# Patient Record
Sex: Female | Born: 1935 | Race: White | Hispanic: No | Smoking: Former smoker
Health system: Southern US, Community
[De-identification: ages and names within clinical notes are randomized; demographics above are authoritative.]

## PROBLEM LIST (undated history)

## (undated) DIAGNOSIS — N183 Chronic kidney disease, stage 3 unspecified: Secondary | ICD-10-CM

## (undated) DIAGNOSIS — E119 Type 2 diabetes mellitus without complications: Secondary | ICD-10-CM

## (undated) DIAGNOSIS — I5032 Chronic diastolic (congestive) heart failure: Secondary | ICD-10-CM

## (undated) DIAGNOSIS — E43 Unspecified severe protein-calorie malnutrition: Secondary | ICD-10-CM

## (undated) DIAGNOSIS — E785 Hyperlipidemia, unspecified: Secondary | ICD-10-CM

## (undated) DIAGNOSIS — M8580 Other specified disorders of bone density and structure, unspecified site: Secondary | ICD-10-CM

## (undated) DIAGNOSIS — H353230 Exudative age-related macular degeneration, bilateral, stage unspecified: Secondary | ICD-10-CM

## (undated) DIAGNOSIS — I480 Paroxysmal atrial fibrillation: Secondary | ICD-10-CM

## (undated) DIAGNOSIS — D509 Iron deficiency anemia, unspecified: Secondary | ICD-10-CM

## (undated) DIAGNOSIS — C4361 Malignant melanoma of right upper limb, including shoulder: Secondary | ICD-10-CM

## (undated) DIAGNOSIS — R634 Abnormal weight loss: Secondary | ICD-10-CM

## (undated) DIAGNOSIS — I350 Nonrheumatic aortic (valve) stenosis: Secondary | ICD-10-CM

## (undated) DIAGNOSIS — I050 Rheumatic mitral stenosis: Secondary | ICD-10-CM

## (undated) DIAGNOSIS — I272 Pulmonary hypertension, unspecified: Secondary | ICD-10-CM

## (undated) DIAGNOSIS — R1310 Dysphagia, unspecified: Secondary | ICD-10-CM

## (undated) DIAGNOSIS — I10 Essential (primary) hypertension: Secondary | ICD-10-CM

## (undated) DIAGNOSIS — H3530 Unspecified macular degeneration: Secondary | ICD-10-CM

## (undated) DIAGNOSIS — K7689 Other specified diseases of liver: Secondary | ICD-10-CM

## (undated) DIAGNOSIS — J90 Pleural effusion, not elsewhere classified: Secondary | ICD-10-CM

## (undated) DIAGNOSIS — R911 Solitary pulmonary nodule: Secondary | ICD-10-CM

## (undated) HISTORY — PX: VAGINAL HYSTERECTOMY: SUR661

## (undated) HISTORY — DX: Hyperlipidemia, unspecified: E78.5

## (undated) HISTORY — PX: CATARACT EXTRACTION W/ INTRAOCULAR LENS  IMPLANT, BILATERAL: SHX1307

## (undated) HISTORY — DX: Essential (primary) hypertension: I10

---

## 1997-12-27 ENCOUNTER — Other Ambulatory Visit: Admission: RE | Admit: 1997-12-27 | Discharge: 1997-12-27 | Payer: Self-pay | Admitting: *Deleted

## 1998-03-03 ENCOUNTER — Encounter: Payer: Self-pay | Admitting: *Deleted

## 1998-03-03 ENCOUNTER — Ambulatory Visit (HOSPITAL_COMMUNITY): Admission: RE | Admit: 1998-03-03 | Discharge: 1998-03-03 | Payer: Self-pay | Admitting: *Deleted

## 1999-03-06 ENCOUNTER — Encounter: Payer: Self-pay | Admitting: *Deleted

## 1999-03-06 ENCOUNTER — Ambulatory Visit (HOSPITAL_COMMUNITY): Admission: RE | Admit: 1999-03-06 | Discharge: 1999-03-06 | Payer: Self-pay | Admitting: *Deleted

## 2000-03-08 ENCOUNTER — Encounter: Payer: Self-pay | Admitting: *Deleted

## 2000-03-08 ENCOUNTER — Ambulatory Visit (HOSPITAL_COMMUNITY): Admission: RE | Admit: 2000-03-08 | Discharge: 2000-03-08 | Payer: Self-pay | Admitting: *Deleted

## 2001-03-14 ENCOUNTER — Encounter: Payer: Self-pay | Admitting: *Deleted

## 2001-03-14 ENCOUNTER — Ambulatory Visit (HOSPITAL_COMMUNITY): Admission: RE | Admit: 2001-03-14 | Discharge: 2001-03-14 | Payer: Self-pay | Admitting: *Deleted

## 2001-05-03 HISTORY — PX: ANKLE SURGERY: SHX546

## 2001-07-25 ENCOUNTER — Ambulatory Visit: Admission: RE | Admit: 2001-07-25 | Discharge: 2001-07-25 | Payer: Self-pay | Admitting: Endocrinology

## 2002-03-04 ENCOUNTER — Encounter: Payer: Self-pay | Admitting: Emergency Medicine

## 2002-03-04 ENCOUNTER — Encounter: Payer: Self-pay | Admitting: Orthopedic Surgery

## 2002-03-04 ENCOUNTER — Inpatient Hospital Stay (HOSPITAL_COMMUNITY): Admission: EM | Admit: 2002-03-04 | Discharge: 2002-03-09 | Payer: Self-pay | Admitting: Emergency Medicine

## 2002-03-05 ENCOUNTER — Encounter: Payer: Self-pay | Admitting: Orthopedic Surgery

## 2002-03-07 ENCOUNTER — Encounter: Payer: Self-pay | Admitting: Orthopedic Surgery

## 2002-10-22 ENCOUNTER — Encounter: Payer: Self-pay | Admitting: Endocrinology

## 2002-10-22 ENCOUNTER — Ambulatory Visit (HOSPITAL_COMMUNITY): Admission: RE | Admit: 2002-10-22 | Discharge: 2002-10-22 | Payer: Self-pay | Admitting: Endocrinology

## 2003-05-14 ENCOUNTER — Ambulatory Visit (HOSPITAL_COMMUNITY): Admission: RE | Admit: 2003-05-14 | Discharge: 2003-05-14 | Payer: Self-pay | Admitting: Orthopedic Surgery

## 2003-10-23 ENCOUNTER — Ambulatory Visit (HOSPITAL_COMMUNITY): Admission: RE | Admit: 2003-10-23 | Discharge: 2003-10-23 | Payer: Self-pay | Admitting: Endocrinology

## 2004-05-03 HISTORY — PX: BACK SURGERY: SHX140

## 2004-10-02 ENCOUNTER — Emergency Department (HOSPITAL_COMMUNITY): Admission: EM | Admit: 2004-10-02 | Discharge: 2004-10-02 | Payer: Self-pay | Admitting: Emergency Medicine

## 2004-10-11 ENCOUNTER — Emergency Department (HOSPITAL_COMMUNITY): Admission: EM | Admit: 2004-10-11 | Discharge: 2004-10-11 | Payer: Self-pay | Admitting: *Deleted

## 2004-12-25 ENCOUNTER — Encounter (HOSPITAL_BASED_OUTPATIENT_CLINIC_OR_DEPARTMENT_OTHER): Admission: RE | Admit: 2004-12-25 | Discharge: 2005-03-25 | Payer: Self-pay | Admitting: Surgery

## 2005-03-29 ENCOUNTER — Encounter (HOSPITAL_BASED_OUTPATIENT_CLINIC_OR_DEPARTMENT_OTHER): Admission: RE | Admit: 2005-03-29 | Discharge: 2005-04-09 | Payer: Self-pay | Admitting: Surgery

## 2005-05-13 ENCOUNTER — Ambulatory Visit (HOSPITAL_COMMUNITY): Admission: RE | Admit: 2005-05-13 | Discharge: 2005-05-13 | Payer: Self-pay | Admitting: Endocrinology

## 2006-05-30 ENCOUNTER — Ambulatory Visit (HOSPITAL_COMMUNITY): Admission: RE | Admit: 2006-05-30 | Discharge: 2006-05-30 | Payer: Self-pay | Admitting: Endocrinology

## 2006-06-13 ENCOUNTER — Encounter: Admission: RE | Admit: 2006-06-13 | Discharge: 2006-06-13 | Payer: Self-pay | Admitting: Endocrinology

## 2007-06-08 ENCOUNTER — Ambulatory Visit (HOSPITAL_COMMUNITY): Admission: RE | Admit: 2007-06-08 | Discharge: 2007-06-08 | Payer: Self-pay | Admitting: Endocrinology

## 2008-07-12 ENCOUNTER — Ambulatory Visit (HOSPITAL_COMMUNITY): Admission: RE | Admit: 2008-07-12 | Discharge: 2008-07-12 | Payer: Self-pay | Admitting: Endocrinology

## 2009-08-01 ENCOUNTER — Ambulatory Visit (HOSPITAL_COMMUNITY): Admission: RE | Admit: 2009-08-01 | Discharge: 2009-08-01 | Payer: Self-pay | Admitting: Endocrinology

## 2010-05-24 ENCOUNTER — Encounter: Payer: Self-pay | Admitting: Endocrinology

## 2010-05-27 ENCOUNTER — Inpatient Hospital Stay (HOSPITAL_COMMUNITY)
Admission: EM | Admit: 2010-05-27 | Discharge: 2010-06-02 | Disposition: A | Discharge status: Other Acute Inpatient Hospital | Payer: Self-pay | Source: Home / Self Care | Attending: Endocrinology | Admitting: Endocrinology

## 2010-05-27 LAB — LIPASE, BLOOD: Lipase: 13 U/L (ref 11–59)

## 2010-05-27 LAB — CK TOTAL AND CKMB (NOT AT ARMC)
CK, MB: 29.1 ng/mL (ref 0.3–4.0)
Relative Index: 1.9 (ref 0.0–2.5)

## 2010-05-27 LAB — DIFFERENTIAL
Basophils Relative: 0 % (ref 0–1)
Eosinophils Absolute: 0 10*3/uL (ref 0.0–0.7)
Eosinophils Relative: 0 % (ref 0–5)
Neutrophils Relative %: 91 % — ABNORMAL HIGH (ref 43–77)

## 2010-05-27 LAB — APTT: aPTT: 37 seconds (ref 24–37)

## 2010-05-27 LAB — COMPREHENSIVE METABOLIC PANEL
AST: 57 U/L — ABNORMAL HIGH (ref 0–37)
BUN: 25 mg/dL — ABNORMAL HIGH (ref 6–23)
CO2: 23 mEq/L (ref 19–32)
Calcium: 9.3 mg/dL (ref 8.4–10.5)
Chloride: 101 mEq/L (ref 96–112)
Creatinine, Ser: 0.91 mg/dL (ref 0.4–1.2)
GFR calc Af Amer: >60 mL/min (ref >60)
GFR calc non Af Amer: 58 mL/min — ABNORMAL LOW (ref >60)
Total Bilirubin: 1.6 mg/dL — ABNORMAL HIGH (ref 0.3–1.2)

## 2010-05-27 LAB — URINALYSIS, ROUTINE W REFLEX MICROSCOPIC
Ketones, ur: >80 mg/dL — AB
Protein, ur: >300 mg/dL — AB
Urine Glucose, Fasting: 250 mg/dL — AB
Urobilinogen, UA: 1 mg/dL (ref 0.0–1.0)

## 2010-05-27 LAB — CBC
MCH: 27.3 pg (ref 26.0–34.0)
MCV: 80.6 fL (ref 78.0–100.0)
Platelets: 207 10*3/uL (ref 150–400)
RBC: 4.69 MIL/uL (ref 3.87–5.11)
RDW: 13.9 % (ref 11.5–15.5)
WBC: 15 10*3/uL — ABNORMAL HIGH (ref 4.0–10.5)

## 2010-05-27 LAB — PROTIME-INR: Prothrombin Time: 18.4 seconds — ABNORMAL HIGH (ref 11.6–15.2)

## 2010-05-27 LAB — URINE MICROSCOPIC-ADD ON

## 2010-05-27 LAB — GLUCOSE, CAPILLARY: Glucose-Capillary: 378 mg/dL — ABNORMAL HIGH (ref 70–99)

## 2010-05-28 LAB — COMPREHENSIVE METABOLIC PANEL
Albumin: 2.1 g/dL — ABNORMAL LOW (ref 3.5–5.2)
Alkaline Phosphatase: 95 U/L (ref 39–117)
BUN: 28 mg/dL — ABNORMAL HIGH (ref 6–23)
CO2: 25 mEq/L (ref 19–32)
Chloride: 103 mEq/L (ref 96–112)
GFR calc non Af Amer: 46 mL/min — ABNORMAL LOW (ref >60)
Glucose, Bld: 275 mg/dL — ABNORMAL HIGH (ref 70–99)
Potassium: 3.7 mEq/L (ref 3.5–5.1)
Total Bilirubin: 0.8 mg/dL (ref 0.3–1.2)

## 2010-05-28 LAB — GLUCOSE, CAPILLARY: Glucose-Capillary: 158 mg/dL — ABNORMAL HIGH (ref 70–99)

## 2010-05-28 LAB — CBC
Hemoglobin: 10.2 g/dL — ABNORMAL LOW (ref 12.0–15.0)
Platelets: 206 10*3/uL (ref 150–400)
RBC: 3.86 MIL/uL — ABNORMAL LOW (ref 3.87–5.11)
WBC: 10.9 10*3/uL — ABNORMAL HIGH (ref 4.0–10.5)

## 2010-05-28 LAB — CARDIAC PANEL(CRET KIN+CKTOT+MB+TROPI)
CK, MB: 5.5 ng/mL — ABNORMAL HIGH (ref 0.3–4.0)
Relative Index: 1.7 (ref 0.0–2.5)
Relative Index: 1.7 (ref 0.0–2.5)
Total CK: 322 U/L — ABNORMAL HIGH (ref 7–177)
Troponin I: 0.05 ng/mL (ref 0.00–0.06)
Troponin I: 0.03 ng/mL (ref 0.00–0.06)

## 2010-05-28 LAB — HEPARIN LEVEL (UNFRACTIONATED): Heparin Unfractionated: 0.1 IU/mL — ABNORMAL LOW (ref 0.30–0.70)

## 2010-05-28 LAB — BRAIN NATRIURETIC PEPTIDE: Pro B Natriuretic peptide (BNP): 371 pg/mL — ABNORMAL HIGH (ref 0.0–100.0)

## 2010-05-29 LAB — BASIC METABOLIC PANEL
CO2: 25 mEq/L (ref 19–32)
Chloride: 108 mEq/L (ref 96–112)
GFR calc non Af Amer: >60 mL/min (ref >60)
Glucose, Bld: 160 mg/dL — ABNORMAL HIGH (ref 70–99)
Potassium: 3.9 mEq/L (ref 3.5–5.1)
Sodium: 139 mEq/L (ref 135–145)

## 2010-05-29 LAB — HEPARIN LEVEL (UNFRACTIONATED): Heparin Unfractionated: 0.14 IU/mL — ABNORMAL LOW (ref 0.30–0.70)

## 2010-05-29 LAB — GLUCOSE, CAPILLARY
Glucose-Capillary: 312 mg/dL — ABNORMAL HIGH (ref 70–99)
Glucose-Capillary: 213 mg/dL — ABNORMAL HIGH (ref 70–99)

## 2010-05-29 LAB — CBC
HCT: 33.8 % — ABNORMAL LOW (ref 36.0–46.0)
Hemoglobin: 11.1 g/dL — ABNORMAL LOW (ref 12.0–15.0)
MCV: 81.4 fL (ref 78.0–100.0)
Platelets: 209 10*3/uL (ref 150–400)
RBC: 4.15 MIL/uL (ref 3.87–5.11)
WBC: 10.1 10*3/uL (ref 4.0–10.5)

## 2010-05-29 LAB — BRAIN NATRIURETIC PEPTIDE: Pro B Natriuretic peptide (BNP): 218 pg/mL — ABNORMAL HIGH (ref 0.0–100.0)

## 2010-05-30 LAB — COMPREHENSIVE METABOLIC PANEL
Albumin: 2 g/dL — ABNORMAL LOW (ref 3.5–5.2)
Alkaline Phosphatase: 79 U/L (ref 39–117)
CO2: 27 mEq/L (ref 19–32)
Calcium: 8.5 mg/dL (ref 8.4–10.5)
Chloride: 105 mEq/L (ref 96–112)
GFR calc Af Amer: >60 mL/min (ref >60)
Sodium: 139 mEq/L (ref 135–145)
Total Protein: 5.3 g/dL — ABNORMAL LOW (ref 6.0–8.3)

## 2010-05-30 LAB — GLUCOSE, CAPILLARY
Glucose-Capillary: 215 mg/dL — ABNORMAL HIGH (ref 70–99)
Glucose-Capillary: 243 mg/dL — ABNORMAL HIGH (ref 70–99)
Glucose-Capillary: 230 mg/dL — ABNORMAL HIGH (ref 70–99)

## 2010-05-30 LAB — CBC
HCT: 31.2 % — ABNORMAL LOW (ref 36.0–46.0)
Hemoglobin: 10 g/dL — ABNORMAL LOW (ref 12.0–15.0)
MCHC: 32.1 g/dL (ref 30.0–36.0)

## 2010-05-31 LAB — COMPREHENSIVE METABOLIC PANEL
AST: 14 U/L (ref 0–37)
Albumin: 2 g/dL — ABNORMAL LOW (ref 3.5–5.2)
Alkaline Phosphatase: 80 U/L (ref 39–117)
BUN: 17 mg/dL (ref 6–23)
Creatinine, Ser: 0.81 mg/dL (ref 0.4–1.2)
GFR calc Af Amer: >60 mL/min (ref >60)
Potassium: 4.4 mEq/L (ref 3.5–5.1)
Total Protein: 5.2 g/dL — ABNORMAL LOW (ref 6.0–8.3)

## 2010-05-31 LAB — GLUCOSE, CAPILLARY
Glucose-Capillary: 201 mg/dL — ABNORMAL HIGH (ref 70–99)
Glucose-Capillary: 188 mg/dL — ABNORMAL HIGH (ref 70–99)

## 2010-05-31 LAB — DIFFERENTIAL
Myelocytes: 0 %
Neutro Abs: 7.5 10*3/uL (ref 1.7–7.7)
Neutrophils Relative %: 81 % — ABNORMAL HIGH (ref 43–77)
Promyelocytes Absolute: 0 %
nRBC: 0 /100 WBC

## 2010-05-31 LAB — CBC
MCV: 81.7 fL (ref 78.0–100.0)
Platelets: 220 10*3/uL (ref 150–400)
RDW: 14.6 % (ref 11.5–15.5)
WBC: 9.2 10*3/uL (ref 4.0–10.5)

## 2010-06-01 LAB — CBC
MCH: 27.1 pg (ref 26.0–34.0)
MCHC: 33.4 g/dL (ref 30.0–36.0)
Platelets: 229 10*3/uL (ref 150–400)
RDW: 14.4 % (ref 11.5–15.5)

## 2010-06-01 LAB — GLUCOSE, CAPILLARY: Glucose-Capillary: 174 mg/dL — ABNORMAL HIGH (ref 70–99)

## 2010-06-01 LAB — HEPARIN LEVEL (UNFRACTIONATED): Heparin Unfractionated: 0.46 IU/mL (ref 0.30–0.70)

## 2010-06-02 LAB — COMPREHENSIVE METABOLIC PANEL
ALT: 21 U/L (ref 0–35)
Albumin: 2.1 g/dL — ABNORMAL LOW (ref 3.5–5.2)
Alkaline Phosphatase: 82 U/L (ref 39–117)
Glucose, Bld: 185 mg/dL — ABNORMAL HIGH (ref 70–99)
Potassium: 3.9 mEq/L (ref 3.5–5.1)
Sodium: 144 mEq/L (ref 135–145)
Total Protein: 5.4 g/dL — ABNORMAL LOW (ref 6.0–8.3)

## 2010-06-02 LAB — GLUCOSE, CAPILLARY: Glucose-Capillary: 171 mg/dL — ABNORMAL HIGH (ref 70–99)

## 2010-06-02 LAB — CBC
HCT: 32.7 % — ABNORMAL LOW (ref 36.0–46.0)
RDW: 14.5 % (ref 11.5–15.5)
WBC: 6.8 10*3/uL (ref 4.0–10.5)

## 2010-06-02 NOTE — Discharge Summary (Signed)
NAMEGENIE, WENKE                ACCOUNT NO.:  [id number]  MEDICAL RECORD NO.:  [id number]          PATIENT TYPE:  INP  LOCATION:  1444                         FACILITY:  [facility]  PHYSICIAN:  Tera Mater. Evlyn Kanner, M.D. DATE OF BIRTH:  [birth year]  DATE OF ADMISSION:  05/27/2010 DATE OF DISCHARGE:                              DISCHARGE SUMMARY   DATE OF ANTICIPATED DISCHARGE:  06/03/2010  DISCHARGE DIAGNOSES: 1. Fall with rhabdomyolysis, now clinically improved with no evidence     of syncope. 2. Atrial fibrillation with an rapidly to response, now converted to     sinus rhythm. 3. Hypertension with fair control. 4. Urinary tract infection with Escherichia coli, now treated 5. Macular degeneration. 6. Type 2 diabetes with fair control, now transitioned back to oral     agents.  CONSULTATIONS:  Cardiology, [facility] saw her and followed her through here.  PROCEDURE:  Included telemetry monitoring.  She also underwent an echocardiogram.  She also had a CT of the head.  HISTORY OF PRESENT ILLNESS:  Ms. Hegstrom is a 77 year old white female, longstanding patient to my practice who lives independently.  She was walking around her home and fell and could not get up.  She was down for 20 plus hours and came into [place] with multiple new issues.  Fortunately, nothing was broken during this fall.  There was no evidence of any syncope.  She did, however, have a significant UTI and issues of new atrial fibrillation with rapid ventricular response.  The patient has done moderately well while here.  She had been very sore and very slow to mobilize.  Just yesterday, she was able to turn over partially with plus assistance.  She has not done much walking.  Her soreness is slowly improving.  Her appetite is doing well.  She is having no breathing trouble or chest pain.  She had converted to sinus rhythm and stayed in sinus rhythm.  She is not a good Coumadin candidate.  Only treated  with aspirin at a modest dose, long term.  This is her first ever cardiac issue.  Her mentation has been fine while here.  Her oxygen saturations have dropped a bit at times, but is doing better now.  Blood pressure was 149/73, pulse 83, respirations 16, temperature 92, O2 saturation 94%.  LABORATORY DATA:  This morning, chemistries reveal sodium 144, potassium 3.9, chloride 105, CO2 of 32, BUN 12, creatinine 0.64, glucose of 185, albumin is low at 2.1, total protein 5.4, estimated GFR is greater than 60.  This morning her white count 6800, hemoglobin 10.7, platelets 284,000.  At presentation, white count was 15,000, hemoglobin 12.8, platelets 208,000.  Initial chemistries, sodium 137, potassium 4.3, chloride 101, CO2 of 23, BUN 25, creatinine 0.91, glucose of 230, estimated GFR greater than 60.  A CK was elevated at 1516 with an MB of 29.1 and relative index was normal at 1.9, troponin was 0.04.  Repeat CK was down to 662 on the second hospital day, then down to 409, then down to 322.  I do not think we have checked it since then. Hemoglobin A1c  was reasonable for age at 7.3%.  BNP on the 27th was only mildly elevated at 218.  Multiple heparin levels were done.  RADIOLOGY TESTING:  A hip x-ray was done on 27th, no acute finding, bilateral hip arthritis, more on the left.  A CT of the head showed no acute intracranial abnormality, atrophy and chronic microvascular disease was present.  A chest x-ray at presentation, enlargement of the cardiac silhouette with no evidence of pulmonary edema, pneumonia  or pleural effusion.  There was some bony changes with spurring in right shoulder and spinal hardware in mid thoracic area.  An echocardiogram was done on the 26th and showed left ventricular ejection fraction normal at 55-60%, left ventricular diastolic parameters were normal. Left atrium was mildly to moderately dilated.  Systolic function was hyperdynamic in the right and a small  pericardial effusion posterior to the heart was noted.  HOSPITAL COURSE:  In summary, a 77 year old female coming after a fall with findings of UTI, rhabdomyolysis with intact renal function and this general soreness and slowness to mobilize.  Her atrial fibrillation has resolved and she is back in sinus rhythm.  She is about to finish out her treatment for her urinary tract infection.  The patient is doing well, but needs some rehabilitation time to get her strength back and get back to independent living.  I think she can return to home setting.  DISCHARGE MEDICATIONS:  Include: 1. Tylenol 650, 1 every 4 hours as needed. 2. Aspirin 81, 2 tablets daily which equals to 160 mg. 3. She is on Cipro 500 twice daily and probably needs that through     February 1. 4. She is newly on diltiazem extended release 180 mg once daily.  This     replaces her prior Norvasc. 5. She is on metoprolol 75 mg twice daily.  This is also a new     medication. 6. We are resuming her oral agents and she is on Actos plus metformin     30/1000, she takes 1/2 tablet once daily. 7. She is on Diovan 320 once daily. 8. Glipizide XL 5 once daily. 9. Onglyza 5 mg once daily. 10.She had been treated with Lantus while here.  I am hoping that she     can go back to not needing insulin.  Obviously, she needed to be     watched, we can keep her on sliding scale for the short-term with 2     units starting at 120-150; 151-175, 4 units and over 176, 6 units     of NovoLog with meals.  No bedtime coverage is necessary.  The patient does need physical and occupational therapy.  She does have visual deficits.  Will have to be compensated for.  We need to watch her heart rhythm as well.          ______________________________ Tera Mater. Evlyn Kanner, M.D.     SAS/MEDQ  D:  06/02/2010  T:  06/02/2010  Job:  [id number]  Electronically Signed by Adrian Prince M.D. on 06/02/2010 10:02:19 PM

## 2010-06-02 NOTE — H&P (Signed)
Latasha Proctor, Latasha Proctor NO.:  [id number]  MEDICAL RECORD NO.:  [id number]          PATIENT TYPE:  EMS  LOCATION:  ED                           FACILITY:  [facility]  PHYSICIAN:  Tera Mater. [place], M.D. DATE OF BIRTH:  [birth year]  DATE OF ADMISSION:  05/27/2010 DATE OF DISCHARGE:                             HISTORY & PHYSICAL   HISTORY OF PRESENT ILLNESS:  Latasha Proctor is a 77 year old white female, longstanding patient of my practice, with a history of type 2 diabetes under good control, hypertension, and advanced macular degeneration. Her last visit with me was in mid December at which time she was doing excellent.  She was in her usual state of good health and had made some dinner and was walking around in her apartment yesterday.  Her foot started to tip and she fell to the floor.  She was unable to get up due to weakness and laid on the floor for roughly 22 hours.  She was awake and alert before the fall.  She remembers all the details preceding it and after it.  She heard the phone ring several times and friends finally found her now 22 hours later.  She was then brought to the emergency room and found to have a new problem with rapid atrial fibrillation, which had not been noted before.  She also has some rhabdomyolysis and some volume deficits.  At the present time, she feels reasonably well.  She is still a bit weak.  She has some left lower back soreness.  She notes no chest pain or no sense of palpitations.  She is not short of breath and has not had any wheezing.  She has no abdominal pain and her bowels are working well.  She was eating without trouble and has had no trouble with this.  She denies any headache.  Her vision is at her poor general baseline with advanced macular degeneration.  She notes no weakness or numbness anywhere.  She is in good spirits.  She is at her excellent mentation baseline.  She has not had any falls like this before.  She is an  appropriate person who lives independently.  PAST MEDICAL HISTORY:  Type 2 diabetes, dating back to about 865, controlled with oral agents, the last A1c was 6.4%.  She has had some diabetic retinopathy, but has primary problem with her vision based on advanced macular degeneration.  She has had a cyst in her thyroid.  She has had hypertension.  She has had carotid disease with 1% to 39% on the right back in 2005.  SURGICAL HISTORY:  Total abdominal hysterectomy in 1990, endometrial polypectomy, laser surgery with macular degeneration, ankle fracture, cataract extraction bilaterally.  FAMILY HISTORY:  Dad died at 62 with heart disease.  Mother died at 74 with old age.  Two brothers are deceased.  She is a widowed mother of 1 and grandmother of 2.  She is a nonsmoker, nondrinker.  MEDICATION LIST:  As of mid December was, 1. Amlodipine 5 mg once daily. 2. Diovan 320 mg once daily. 3. Glucotrol XL 5 mg once daily. 4.  Actoplus Met 30/1000 one-half tablet daily. 5. Centrum Silver daily. 6. Aspirin 81 mg daily. 7. Aleve 220 daily. 8. Onglyza 5 mg daily. 9. Benazepril 20 mg daily.  ALLERGIES:  Her allergies are listed as none.  PHYSICAL EXAMINATION:  VITAL SIGNS:  Blood pressure right now is 123/74, pulse rate is 130 irregularly irregular and atrial fib on the monitor, respiration rate is 20, temperature is 98.0, O2 saturation is 99%. GENERAL:  We have a relatively healthy-appearing white female, sitting up in no distress. HEENT:  Sclerae anicteric.  Extraocular movements are intact.  There is no evidence of head or face trauma.  No evidence of jaw or biting of her tongue are noted.  Tongue protrudes midline.  Face is symmetric.  Oral mucous membranes are moist. NECK:  Supple.  I could not appreciate any bruits here in the emergency room. LUNGS:  Clear without wheezes, rales, or rhonchi.  No accessory muscles are in use. HEART:  Tachycardic and regular with systolic  murmur. ABDOMEN:  Soft, nontender with no masses, no splenomegaly, no areas of tenderness are noted. EXTREMITIES:  Strong distal pulses with trace to 1+ edema.  Significant bunion changes are noted on the feet. NEUROLOGIC:  The patient is awake, alert.  Her mentation is excellent. Speech is clear.  No resting tremor is present.  She recognizes me easily upon entrance to the room.  She is talking with her preacher without difficulty.  She notes good memory of all these events as noted.  LABORATORY DATA:  She has too numerous to count white cells in her urine with 3-6 red cells, it is yellow and cloudy, specific gravity 1.020, pH of 6, urine glucose 250, small bilirubin, greater than 80 mg percent ketones, greater than 300 mg percent protein, and moderate leukocytes. Troponin is normal at 0.04, lipase is normal at 13.  Her CK is 1516 with an MB of 29.1 and relative index of 1.9.  Her chemistries show sodium 137, potassium 4.3, chloride 101, CO2 of 23, BUN 25, creatinine 0.91, glucose 230.  Estimated GFR is 58 mL per minute.  Alkaline phosphatase is 118, total bilirubin is 1.6, AST is 57, ALT is 29, albumin is 2.9, calcium 9.3.  White count is 15,000, hemoglobin 12.8, MCV 80.6, platelets 207,000.  INR is 1.51, PTT is 37.  RADIOLOGY TESTING:  A chest x-ray shows enlargement of the cardiac silhouette with no evidence of pulmonary edema, pneumonia, or pleural effusion. Chronic bony changes were noted with degenerative changes in the right shoulder.  Spinal hardware was in place in the mid thoracic region as well.  In summary, we have a 77 year old white female with a fall at home without syncope.  She does have some moderate rhabdomyolysis with an intact renal function.  She had new issues of urinary tract infection, has already received her first dose of medications, in rapid atrial fibrillation which is totally a new problem.  Her cardiac enzymes are fine with no evidence of demand  ischemia.  We really have not done any cardiac tests on her in the past.  The patient is tolerating this well with blood pressure normal to high.  She is relatively and remarkably asymptomatic.  At the present time, we are going to bring her into the step-down unit, put her on a Cardizem drip, and check enzymes in the morning again.  We are going to check an echocardiogram just to get an idea of ejection fraction and left atrial size.  Antibiotics for the UTI  will be given.  Her blood pressure medicines will be continued.  Her blood sugar medicines will be changed to a sliding scale only at the present time with all the oral agents.  There is no evidence any fluid overload from her agents.  A minor liver function testing will be rechecked.  There is no evidence of any stroke or neurologic deficits.  I did call Cardiology for them to read the echo and see her tomorrow.  The patient should do quite well.  We will gently hydrate her, but not overdue it based on not knowing her ejection fraction.  She will be at bed rest status. Subcutaneous Lovenox will be given.          ______________________________ Tera Mater. Evlyn Kanner, M.D.     SAS/MEDQ  D:  05/27/2010  T:  05/28/2010  Job:  [id number]  Electronically Signed by Adrian Prince M.D. on 06/02/2010 10:02:15 PM

## 2010-07-28 ENCOUNTER — Other Ambulatory Visit (HOSPITAL_COMMUNITY): Payer: Self-pay | Admitting: Endocrinology

## 2010-07-28 DIAGNOSIS — Z1231 Encounter for screening mammogram for malignant neoplasm of breast: Secondary | ICD-10-CM

## 2010-08-14 ENCOUNTER — Ambulatory Visit (HOSPITAL_COMMUNITY)
Admission: RE | Admit: 2010-08-14 | Discharge: 2010-08-14 | Disposition: A | Discharge status: Home / Self Care | Payer: Medicare Other | Source: Ambulatory Visit | Attending: Endocrinology | Admitting: Endocrinology

## 2010-08-14 DIAGNOSIS — Z1231 Encounter for screening mammogram for malignant neoplasm of breast: Secondary | ICD-10-CM | POA: Insufficient documentation

## 2010-09-18 NOTE — Op Note (Signed)
NAME:  Latasha Proctor, Latasha Proctor                          ACCOUNT NO.:  [id number]   MEDICAL RECORD NO.:  [id number]                   PATIENT TYPE:  INP   LOCATION:  0481                                 FACILITY:  [facility]   PHYSICIAN:  Georges Lynch. Darrelyn Hillock, M.D.             DATE OF BIRTH:  10/19/1917   DATE OF PROCEDURE:  03/05/2002  DATE OF DISCHARGE:                                 OPERATIVE REPORT   SURGEON:  Georges Lynch. Darrelyn Hillock, M.D.   ASSISTANT:  Ebbie Ridge. Paitsel, P.A.   PREOPERATIVE DIAGNOSIS:  Trimalleolar fracture, right ankle.   POSTOPERATIVE DIAGNOSIS:  Trimalleolar fracture, right ankle.   OPERATION:  Open reduction internal fixation of trimalleolar fracture, right  ankle.   PROCEDURE:  An initial prep was carried out with a Betadine scrub followed  by a Duraprep scrub.  Following this, the leg was exsanguinated with an  Esmarch and the tourniquet was elevated to 350 mmHg.  An incision was made  after I did an initial reduction of the ankle.  The incision was made over  the medial malleolus at which time I identified a medial malleolar fracture.  There was a very small fragment.  I reduced the fragment.  The drill hole  and a guide pin was placed across the fracture site after I inspected the  joint.  There were no loose fragments in the joint.  The drill hole was made  across the fracture site and a screw was inserted but the fracture fragment  was so small, it was severely osteoporotic.  The screw really was not able  to hold the fracture, so I removed that screw and washer and then made an  attempt to reduce the fracture again and whatever fragments I could gather  together, and I utilized two threaded Steinmann pins and I was able to  stabilize the medial ankle.  There was a gap there where the fracture  occurred so I inserted some bone graft with ALLOMATRIX-C bone graft into the  fracture site.  Great care was taken not to put any of the graft in the  joint.  I then repaired the  deltoid tendon and ligament in the usual  fashion.  The wound then was closed in the usual fashion over two threaded  Steinmann pins.  The pins were cut flush with the bone.  The ankle then was  placed in inversion and a short leg cast was applied.  Note I was able to  easily reduce the posterior malleolar fracture fragment as well.  The  patient was placed in a short leg cast as I mentioned.  She left the  operating room in satisfactory condition.  Prior to surgery she was given 1  gram of IV Ancef.  Ronald A. Darrelyn Hillock, M.D.   RAG/MEDQ  D:  03/05/2002  T:  03/05/2002  Job:  [id number]   cc:   Durwin Reges., M.D.  [phone] [place]  [place] [address]  Fax: [phone]

## 2010-09-18 NOTE — Discharge Summary (Signed)
NAME:  Latasha Proctor, Latasha Proctor                          ACCOUNT NO.:  [id number]   MEDICAL RECORD NO.:  [id number]                   PATIENT TYPE:  INP   LOCATION:  0481                                 [facility]   PHYSICIAN:  Georges Lynch. Gioffre, M.D.             DATE OF BIRTH:  10/30/1917   DATE OF ADMISSION:  03/04/2002  DATE OF DISCHARGE:  03/09/2002                                 DISCHARGE SUMMARY   ADMISSION DIAGNOSES:  1. Status post fall and trimalleolar ankle fracture on the right.  2. Diabetes mellitus type 2.  3. Hypertension.  4. Hypercholesterolemia.  5. Urinary incontinence.  6. Macular degeneration.   DISCHARGE DIAGNOSES:  1. Status post fall and trimalleolar right ankle fracture, status post open     reduction internal fixation of right ankle.  2. Type 2 diabetes mellitus.  3. Hypertension.  4. Hypercholesterolemia.  5. Macular degeneration.  6. Urinary incontinence.   PROCEDURES:  The patient was taken to the operating room on March 05, 2002  and underwent an ORIF of the right ankle.  Surgeon was Dr. Ranee Gosselin;  assistant was Terie Purser, P.A.-C.  Surgery was done under general  anesthesia.   CONSULTS:  1. Physical medicine and rehabilitation with Dr. Faith Rogue.  2. Physical therapy/occupational therapy.  3. Social Web designer.  4. Pharmacy for Coumadin.  5. Medical consultant:  [facility], Dr. Adrian Prince and Dr.     Geoffry Paradise.   BRIEF HISTORY:  The patient is an 77 year old female who fell at home on  March 04, 2002.  The patient says she tripped; denies any loss of  consciousness.  She was subsequently brought to the [facility] emergency  department where she was revealed to have a trimalleolar ankle fracture and  dislocation on the right.   LABORATORY DATA:  CBC on admission revealed hemoglobin 12.1, hematocrit  34.9, white blood cell count 10.1, red blood cell count 3.99.  Differential  on this showed  neutrophil high at 85, lymph low at 9.  Coagulation studies  on admission were all within normal limits.  At the time of discharge, PT  and INR were 22.8 and 3.3 on Coumadin therapy.  Routine chemistry on  admission showed glucose high at 211.  Urinalysis on admission showed 250  mg/dcl of glucose, trace amount of ketones.   EKG on admission revealed sinus tachycardia.  Preoperative x-rays on  March 04, 2002 revealed a trimalleolar right ankle fracture with a slight  tibiotalar subluxation.  Preoperative chest film on March 04, 2002  revealed cardiomegaly; there was no active lung disease.  Follow-up ankle  postsurgery on March 07, 2002 revealed status post wire placement across  distal right tibial fracture.   HOSPITAL COURSE:  The patient was admitted to [facility] and taken  to the operating room.  She underwent the above-stated procedure without  complications.  The patient tolerated  the procedure well and was allowed to  return to the recovery room and the orthopedic floor to continue  postoperative care.  Postoperative day #1 on March 06, 2002 the patient  was complaining of right ankle pain as expected.  Otherwise, she was doing  well, had a Tmax of 101.3, was afebrile at the time of being seen.  Rehab  consult was pending; physical therapy and occupational therapy were to see  on this day.  On March 07, 2002, postoperative day #2, the patient was  resting comfortably, improved from yesterday.  Rehab felt that it was best  for her to go to a skilled nursing facility.  She was continuing with  physical therapy and occupational therapy.  On March 08, 2002,  postoperative day #3, the patient was doing well, had no complaints, good  sensation in the foot.  On March 09, 2002, seen by medicine; clinically  was doing well.  Was okay to be discharged to [facility].   DISPOSITION:  The patient to be discharged to [facility] on  March 09, 2002.   DISCHARGE MEDICATIONS:  1. Pravachol 20 mg one p.o. at q.d. 1800 hours.  2. Glucotrol 5 mg one p.o. b.i.d.  3. Coumadin per pharmacy protocol.  4. Novolog insulin 25 units subcu q.h.s.  5. Novolog insulin 2-15 units subcu t.i.d.  6. Norvasc 5 mg p.o. at 0800.  7. Colace 100 mg p.o. b.i.d.  8. Diovan 325 mg p.o. q.d. at 0800.  9. Cardura 2 mg p.o. q.d. at 0800.  10.      Hydrochlorothiazide 25 mg Monday, Wednesday, Friday p.o. p.r.n.  11.      Ambien 10 mg p.o. q.h.s. p.r.n.  12.      Percocet one to two p.o. q.3-6h. p.r.n.  13.      Reglan 10 mg p.o. q.6h. p.r.n.  14.      Phenergan 25 mg p.o. q.4h. p.r.n.  15.      Robaxin 500 mg p.o. q.6h. p.r.n.  16.      Laxative of choice one unit p.o. p.r.n.  17.      Enema of choice one unit PR p.r.n.  18.      Tylenol 325-650 mg p.o. q.4h. p.r.n. temperature greater than 101     or headache.   DIET:  As tolerated.   ACTIVITY:  The patient is to be nonweightbearing to right lower extremity.  She is to use the walker.   FOLLOW-UP:  The patient is to follow up if possible at two weeks from  surgery or as soon as possible upon discharge from [facility].   CONDITION ON DISCHARGE:  Stable, improved.    Clarene Reamer, P.A.-C.                   Ronald A. Darrelyn Hillock, M.D.     SW/MEDQ  D:  03/09/2002  T:  03/09/2002  Job:  [id number]

## 2010-09-18 NOTE — Consult Note (Signed)
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 Sevier, M.D. DATE OF BIRTH:  1917-05-16   DATE OF CONSULTATION:  12/30/2004  DATE OF DISCHARGE:                                   CONSULTATION   HISTORY:  This 77 year old white female is referred at the courtesy of Dr.  Sharolyn Douglas at the [facility] for assistance with management  of chronic wounds of the back.   The patient sustained a fall in early June of this year and since that time  had had trouble with progressive pain in her dorsal spine area.  Initially x-  rays did not suggest any fractures but over time with recurrence and  worsening of the pain while visiting with a daughter in [place], the  patient was taken to the hospital and found to have a T8 fracture and  because of this being unstable was fused from T6-T10.  She subsequently  returned to [facility] doing apparently satisfactorily and was seen by Dr.  Laurene Footman who removed her sutures as directed by the surgeon, Dr. Clent Ridges  at [facility] in [place], [place].  Initially there  appeared to be some separation of the sutures in the middle portion of the  surgical incision and this has progressed to a degree of dehiscence both  there and also a smaller opening more distally.  For these she was seen by  Dr. Noel Gerold and it was his feeling that she needed evaluation and ongoing care  here and accordingly was referred.   PAST MEDICAL HISTORY:  Notable for type two diabetes diagnosed several years  ago and in good control with recent hemoglobin A1C of 6.5% and a history of  a fractured right ankle some time in the past.  She also apparently has had  some hypertension which has not created problems.  She has had a thyroid  cyst. There was a question of some carotid disease the  details of which are  not known.   CURRENT MEDICATIONS:  Her regular medications include Lotrel 5/20 one  daily,Diovan 320 mg one daily, Doxazosin 4 mg daily, Glipizide XL 5 mg  daily, Avandamet to 2/500 mg one b.i.d., furosemide 20 mg daily, Centrum  Silver, baby aspirin, p.r.n. Advil, Ocuvite and Keflex 500 mg q.i.d. which  she had been prescribed last week and for which she has one additional dose  to take.   ALLERGIES:  She has no known medicinal allergies.   PHYSICAL EXAMINATION:  BACK:  Examination today is limited to the mid back  area of the wound where there is a surgical incision seen which extends from  approximately the T4 level to the T8 level.  In the mid portion of this is  an open wound which measures 2.5 cm in length, 0.9 cm in width, 1.5 cm in  depth except for a tract which measures approximately 2 cm at the distal end  of  the wound.  This tract does not appear to probe to metal or to bone.  Further down at the distal end of the wound is a more superficial wound  measuring 1.2x0.4x0.01 cm which has a soft eschar present on it.   DISPOSITION:  Some fibrinous debris is debrided from the proximal wound and  is then dressed with Iodoform packing and a dry dressing with the plan being  to progress to vac therapy which has been arranged to be instituted within  the next 48 hours.   The more distal wound is subjected to full-thickness debridement of the  eschar and then is dressed with Neosporin and a Band-Aid which dressing is  to continue on a daily basis at home.   It is explained to the patient and her daughter in attendance that the real  concern here would be if this wound should eventually penetrate down to  hardware in which case it would be very very difficult to heal.  We  explained at this point that that did not appear to be the case and that our  efforts will be directing  and avoiding that happening.  We also indicated  we did not see a need for hyperbaric  oxygen therapy for this type wound at  this point.   The patient was advised to complete her Keflex as scheduled and to extend it  one additional week (she indicated she had a refill available on her  prescription).   Follow-up visit here will be in two weeks to see what progress she has made  with the vac therapy.           ______________________________  Jonelle Sports. Cheryll Cockayne, M.D.     RES/MEDQ  D:  01/19/2005  T:  01/19/2005  Job:  [id number]   cc:   Jeannett Senior A. Evlyn Kanner, M.D.  [address]  [place]  [place] [address]  Fax: [phone]   Sharolyn Douglas, M.D.  Fax: [phone]

## 2010-09-18 NOTE — Op Note (Signed)
NAME:  Latasha Proctor, Latasha Proctor                          ACCOUNT NO.:  [id number]   MEDICAL RECORD NO.:  [id number]                   PATIENT TYPE:  AMB   LOCATION:  DAY                                  FACILITY:  [facility]   PHYSICIAN:  Georges Lynch. Darrelyn Hillock, M.D.             DATE OF BIRTH:  [birth year]   DATE OF PROCEDURE:  05/13/2002  DATE OF DISCHARGE:                                 OPERATIVE REPORT   SURGEON:  Georges Lynch. Darrelyn Hillock, M.D.   ASSISTANT:  Nurse.   PREOPERATIVE DIAGNOSES:  1. Healed ankle fracture on the right.  2. Migrating Steinmann pin, right ankle.   POSTOPERATIVE DIAGNOSES:  1. Healed ankle fracture on the right.  2. Migrating Steinmann pin, right ankle.   OPERATION:  Removal of the Steinmann pin, right ankle.   DESCRIPTION OF PROCEDURE:  Under local anesthesia standby, a routine  orthopedic prep and draping of the right ankle was carried out.  A small  incision was made over the pin track site.  I did excise the pin track site  medially and then went down and identified the Steinmann pin and easily  removed it with a hemostat.  The entire pin was removed.  I thoroughly  irrigated out the area and closed the skin only with 3-0 nylon suture.  Sterile Neosporin dressing was applied.  The patient left the operating room  in satisfactory condition.  She had 1 g of IV Ancef preop.   FOLLOW-UP CARE:  She will be in a cast shoe for weightbearing and see in a  week or prior to if there is a problem.   MEDICINE:  I gave her some Mepergan Fortis for pain.                                               Ronald A. Darrelyn Hillock, M.D.    RAG/MEDQ  D:  05/14/2003  T:  05/14/2003  Job:  [id number]

## 2011-08-18 ENCOUNTER — Other Ambulatory Visit (HOSPITAL_COMMUNITY): Payer: Self-pay | Admitting: Endocrinology

## 2011-08-18 DIAGNOSIS — Z1231 Encounter for screening mammogram for malignant neoplasm of breast: Secondary | ICD-10-CM

## 2012-01-14 ENCOUNTER — Encounter (INDEPENDENT_AMBULATORY_CARE_PROVIDER_SITE_OTHER): Payer: Self-pay

## 2012-01-24 ENCOUNTER — Ambulatory Visit (INDEPENDENT_AMBULATORY_CARE_PROVIDER_SITE_OTHER): Payer: Medicare Other | Admitting: General Surgery

## 2012-01-24 ENCOUNTER — Encounter (INDEPENDENT_AMBULATORY_CARE_PROVIDER_SITE_OTHER): Payer: Self-pay | Admitting: General Surgery

## 2012-01-24 VITALS — BP 162/70 | HR 96 | Temp 98.4°F | Resp 16 | Ht 64.0 in | Wt 171.8 lb

## 2012-01-24 DIAGNOSIS — D0360 Melanoma in situ of unspecified upper limb, including shoulder: Secondary | ICD-10-CM | POA: Insufficient documentation

## 2012-01-24 DIAGNOSIS — C4360 Malignant melanoma of unspecified upper limb, including shoulder: Secondary | ICD-10-CM

## 2012-01-24 NOTE — Progress Notes (Signed)
Patient ID: Latasha Proctor, female   DOB: 02-27-1918, 78 y.o.   MRN: [id number]  Chief Complaint  Patient presents with  . Pre-op Exam    ue melanoma in situ    HPI Latasha Proctor is a 78 y.o. female.  Consult from Dr. Terri Piedra HPI This is a 78 year old female has a history of diabetes and hypertension. She is otherwise fairly healthy. She went after sure her daughter noticed a mass on her back. This has been biopsied in the squamous cell carcinoma in situ. She was also noted at that time to have some discoloration as well as a lesion on her left upper extremity. This had been there for some time. This underwent a shave biopsy shows melanoma in situ, lentigo maligna type. She comes in today to discuss her treatment options for this. She has no real complaints about this area at all. Past Medical History  Diagnosis Date  . Arthritis   . Diabetes mellitus   . Hyperlipidemia   . Hypertension     Past Surgical History  Procedure Date  . Ankle surgery   . Back     Family History  Problem Relation Age of Onset  . Diabetes Mother   . Stroke Father     Social History History  Substance Use Topics  . Smoking status: Former Games developer  . Smokeless tobacco: Not on file   Comment: quit over 30 years ago  . Alcohol Use: 0.0 oz/week     occ    No Known Allergies  Current Outpatient Prescriptions  Medication Sig Dispense Refill  . amLODipine (NORVASC) 5 MG tablet       . aspirin 81 MG tablet Take 81 mg by mouth daily.      . benazepril (LOTENSIN) 20 MG tablet       . DIOVAN 320 MG tablet       . GLIPIZIDE XL 5 MG 24 hr tablet       . metFORMIN (GLUCOPHAGE) 1000 MG tablet       . Multiple Vitamins-Minerals (MULTIVITAMIN WITH MINERALS) tablet Take 1 tablet by mouth daily.      . naproxen sodium (ANAPROX) 220 MG tablet Take 220 mg by mouth 2 (two) times daily with a meal.      . ONGLYZA 5 MG TABS tablet         Review of Systems Review of Systems  Constitutional: Negative for fever,  chills and unexpected weight change.  HENT: Positive for hearing loss. Negative for congestion, sore throat, trouble swallowing and voice change.   Eyes: Negative for visual disturbance.  Respiratory: Negative for cough and wheezing.   Cardiovascular: Negative for chest pain, palpitations and leg swelling.  Gastrointestinal: Negative for nausea, vomiting, abdominal pain, diarrhea, constipation, blood in stool, abdominal distention and anal bleeding.  Genitourinary: Negative for hematuria, vaginal bleeding and difficulty urinating.  Musculoskeletal: Negative for arthralgias.  Skin: Negative for rash and wound.  Neurological: Negative for seizures, syncope and headaches.  Hematological: Negative for adenopathy. Does not bruise/bleed easily.  Psychiatric/Behavioral: Negative for confusion.    Blood pressure 162/70, pulse 96, temperature 98.4 F (36.9 C), temperature source Temporal, resp. rate 16, height 5\' 4"  (1.626 m), weight 171 lb 12.8 oz (77.928 kg).  Physical Exam Physical Exam  Vitals reviewed. Constitutional: She appears well-developed and well-nourished.  Cardiovascular: Normal rate, regular rhythm and normal heart sounds.   Pulmonary/Chest: Effort normal and breath sounds normal. She has no wheezes. She has no rales.  Musculoskeletal:  Arms:   Data Reviewed Path and notes from Dr. Elease Etienne office  Assessment    Left upper extremity melanoma in situ    Plan    We discussed wide local excision. I discussed wide local excision with margins. We discussed possible positive margins after excision. We discussed keeping her in the hospital overnight due to were advanced age as well as the fact that she will need some help after this as well. I discussed with her the risks of bleeding, seroma, infection. We discussed the stitches may be on the outside as well. We discussed observation as a treatment for this is not really a viable option given the fact that this is an early  melanoma. She understands the need for surgery we will plan on proceeding as soon as possible.       Devereaux Grayson 01/24/2012, 3:03 PM

## 2012-01-26 ENCOUNTER — Ambulatory Visit (INDEPENDENT_AMBULATORY_CARE_PROVIDER_SITE_OTHER): Payer: Medicare Other

## 2012-01-26 DIAGNOSIS — Z23 Encounter for immunization: Secondary | ICD-10-CM

## 2012-01-27 ENCOUNTER — Encounter (HOSPITAL_COMMUNITY): Payer: Self-pay

## 2012-01-28 NOTE — Patient Instructions (Addendum)
20 Rylah CORELLA DEMCHAK  01/28/2012   Your procedure is scheduled on: 02/01/2012 1130am-1230pm  Report to [facility] at 0900 AM.  Call this number if you have problems the morning of surgery: [phone]   Remember:   Do not eat food:After Midnight.  May have clear liquids:until Midnight .    Take these medicines the morning of surgery with A SIP OF WATER:    Do not wear jewelry, make-up or nail polish.  Do not wear lotions, powders, or perfumes.   Do not shave 48 hours prior to surgery.   Do not bring valuables to the hospital.  Contacts, dentures or bridgework may not be worn into surgery.  Leave suitcase in the car. After surgery it may be brought to your room.  For patients admitted to the hospital, checkout time is 11:00 AM the day of discharge.      Special Instructions: SEE CHG INSTRUCTION SHEET    Please read over the following fact sheets that you were given: MRSA Information, coughing and deep breathing exercises , leg exercises

## 2012-01-31 ENCOUNTER — Encounter (HOSPITAL_COMMUNITY)
Admission: RE | Admit: 2012-01-31 | Discharge: 2012-01-31 | Disposition: A | Discharge status: Home / Self Care | Payer: Medicare Other | Source: Ambulatory Visit | Attending: General Surgery | Admitting: General Surgery

## 2012-01-31 ENCOUNTER — Ambulatory Visit (HOSPITAL_COMMUNITY)
Admission: RE | Admit: 2012-01-31 | Discharge: 2012-01-31 | Disposition: A | Discharge status: Home / Self Care | Payer: Medicare Other | Source: Ambulatory Visit | Attending: General Surgery | Admitting: General Surgery

## 2012-01-31 ENCOUNTER — Encounter (HOSPITAL_COMMUNITY): Payer: Self-pay

## 2012-01-31 DIAGNOSIS — Z01818 Encounter for other preprocedural examination: Secondary | ICD-10-CM | POA: Insufficient documentation

## 2012-01-31 DIAGNOSIS — C4360 Malignant melanoma of unspecified upper limb, including shoulder: Secondary | ICD-10-CM | POA: Insufficient documentation

## 2012-01-31 DIAGNOSIS — Z01812 Encounter for preprocedural laboratory examination: Secondary | ICD-10-CM | POA: Insufficient documentation

## 2012-01-31 DIAGNOSIS — I517 Cardiomegaly: Secondary | ICD-10-CM | POA: Insufficient documentation

## 2012-01-31 DIAGNOSIS — Z01810 Encounter for preprocedural cardiovascular examination: Secondary | ICD-10-CM | POA: Insufficient documentation

## 2012-01-31 HISTORY — DX: Unspecified macular degeneration: H35.30

## 2012-01-31 LAB — CBC WITH DIFFERENTIAL/PLATELET
HCT: 34.9 % — ABNORMAL LOW (ref 36.0–46.0)
Hemoglobin: 11 g/dL — ABNORMAL LOW (ref 12.0–15.0)
Lymphocytes Relative: 13 % (ref 12–46)
Monocytes Absolute: 0.4 10*3/uL (ref 0.1–1.0)
Monocytes Relative: 9 % (ref 3–12)
Neutro Abs: 3.4 10*3/uL (ref 1.7–7.7)
RBC: 4.19 MIL/uL (ref 3.87–5.11)
WBC: 4.5 10*3/uL (ref 4.0–10.5)

## 2012-01-31 LAB — BASIC METABOLIC PANEL
Chloride: 104 mEq/L (ref 96–112)
GFR calc Af Amer: 54 mL/min — ABNORMAL LOW (ref >90)
Potassium: 4.8 mEq/L (ref 3.5–5.1)

## 2012-01-31 NOTE — Progress Notes (Signed)
Requested most recent ekg from office of Dr Evlyn Kanner.

## 2012-01-31 NOTE — Progress Notes (Signed)
Patient does not have any history of nausea /vomiting with surgery.  Patient concerned about nausea/vomiting after surgery due to conversations with prior patients.

## 2012-01-31 NOTE — Progress Notes (Signed)
No previous ekg at PCP office of Dr Laurene Footman.

## 2012-02-01 ENCOUNTER — Observation Stay (HOSPITAL_COMMUNITY)
Admission: RE | Admit: 2012-02-01 | Discharge: 2012-02-02 | Disposition: A | Discharge status: Home / Self Care | Payer: Medicare Other | Source: Ambulatory Visit | Attending: General Surgery | Admitting: General Surgery

## 2012-02-01 ENCOUNTER — Ambulatory Visit (HOSPITAL_COMMUNITY): Payer: Medicare Other | Admitting: Anesthesiology

## 2012-02-01 ENCOUNTER — Encounter (HOSPITAL_COMMUNITY)
Admission: RE | Disposition: A | Discharge status: Home / Self Care | Payer: Self-pay | Source: Ambulatory Visit | Attending: General Surgery

## 2012-02-01 ENCOUNTER — Encounter (HOSPITAL_COMMUNITY): Payer: Self-pay | Admitting: *Deleted

## 2012-02-01 ENCOUNTER — Encounter (HOSPITAL_COMMUNITY): Payer: Self-pay | Admitting: Anesthesiology

## 2012-02-01 DIAGNOSIS — Z79899 Other long term (current) drug therapy: Secondary | ICD-10-CM | POA: Insufficient documentation

## 2012-02-01 DIAGNOSIS — E119 Type 2 diabetes mellitus without complications: Secondary | ICD-10-CM | POA: Insufficient documentation

## 2012-02-01 DIAGNOSIS — E785 Hyperlipidemia, unspecified: Secondary | ICD-10-CM | POA: Insufficient documentation

## 2012-02-01 DIAGNOSIS — C4360 Malignant melanoma of unspecified upper limb, including shoulder: Secondary | ICD-10-CM

## 2012-02-01 DIAGNOSIS — Z01818 Encounter for other preprocedural examination: Secondary | ICD-10-CM | POA: Insufficient documentation

## 2012-02-01 DIAGNOSIS — Z01812 Encounter for preprocedural laboratory examination: Secondary | ICD-10-CM | POA: Insufficient documentation

## 2012-02-01 DIAGNOSIS — I10 Essential (primary) hypertension: Secondary | ICD-10-CM | POA: Insufficient documentation

## 2012-02-01 DIAGNOSIS — Z01810 Encounter for preprocedural cardiovascular examination: Secondary | ICD-10-CM | POA: Insufficient documentation

## 2012-02-01 HISTORY — PX: MELANOMA EXCISION: SHX5266

## 2012-02-01 LAB — GLUCOSE, CAPILLARY
Glucose-Capillary: 114 mg/dL — ABNORMAL HIGH (ref 70–99)
Glucose-Capillary: 75 mg/dL (ref 70–99)

## 2012-02-01 SURGERY — EXCISION, MELANOMA
Anesthesia: General | Site: Arm Upper | Laterality: Left | Wound class: Clean

## 2012-02-01 MED ORDER — LIDOCAINE HCL 1 % IJ SOLN
INTRAMUSCULAR | Status: DC | PRN
  Administered 2012-02-01: 100 mg via INTRADERMAL

## 2012-02-01 MED ORDER — GLIPIZIDE ER 5 MG PO TB24
5.0000 mg | ORAL_TABLET | Freq: Every day | ORAL | Status: DC
  Filled 2012-02-01 (×2): qty 1

## 2012-02-01 MED ORDER — ASPIRIN 81 MG PO CHEW
81.0000 mg | CHEWABLE_TABLET | Freq: Every day | ORAL | Status: DC
  Filled 2012-02-01: qty 1

## 2012-02-01 MED ORDER — SODIUM CHLORIDE 0.9 % IV SOLN: INTRAVENOUS | Status: DC

## 2012-02-01 MED ORDER — PROPOFOL 10 MG/ML IV EMUL
INTRAVENOUS | Status: DC | PRN
  Administered 2012-02-01: 100 mg via INTRAVENOUS

## 2012-02-01 MED ORDER — LACTATED RINGERS IV SOLN
INTRAVENOUS | Status: DC
  Administered 2012-02-01: 11:00:00 via INTRAVENOUS

## 2012-02-01 MED ORDER — BUPIVACAINE HCL (PF) 0.25 % IJ SOLN
INTRAMUSCULAR | Status: AC
  Filled 2012-02-01: qty 30

## 2012-02-01 MED ORDER — ACETAMINOPHEN 650 MG RE SUPP: 650.0000 mg | Freq: Four times a day (QID) | RECTAL | Status: DC | PRN

## 2012-02-01 MED ORDER — 0.9 % SODIUM CHLORIDE (POUR BTL) OPTIME
TOPICAL | Status: DC | PRN
  Administered 2012-02-01: 1000 mL

## 2012-02-01 MED ORDER — ASPIRIN 81 MG PO TABS: 81.0000 mg | ORAL_TABLET | Freq: Every day | ORAL | Status: DC

## 2012-02-01 MED ORDER — CEFAZOLIN SODIUM-DEXTROSE 2-3 GM-% IV SOLR
INTRAVENOUS | Status: AC
  Filled 2012-02-01: qty 50

## 2012-02-01 MED ORDER — METOPROLOL TARTRATE 25 MG PO TABS
25.0000 mg | ORAL_TABLET | Freq: Two times a day (BID) | ORAL | Status: DC
  Filled 2012-02-01 (×2): qty 1

## 2012-02-01 MED ORDER — BACITRACIN ZINC 500 UNIT/GM EX OINT
TOPICAL_OINTMENT | CUTANEOUS | Status: AC
  Filled 2012-02-01: qty 15

## 2012-02-01 MED ORDER — ACETAMINOPHEN 10 MG/ML IV SOLN
INTRAVENOUS | Status: AC
  Filled 2012-02-01: qty 100

## 2012-02-01 MED ORDER — OXYCODONE HCL 5 MG PO TABS: 5.0000 mg | ORAL_TABLET | ORAL | Status: DC | PRN

## 2012-02-01 MED ORDER — FENTANYL CITRATE 0.05 MG/ML IJ SOLN
INTRAMUSCULAR | Status: DC | PRN
  Administered 2012-02-01: 50 ug via INTRAVENOUS

## 2012-02-01 MED ORDER — ONDANSETRON HCL 4 MG/2ML IJ SOLN
INTRAMUSCULAR | Status: DC | PRN
  Administered 2012-02-01: 4 mg via INTRAVENOUS

## 2012-02-01 MED ORDER — ONDANSETRON HCL 4 MG/2ML IJ SOLN: 4.0000 mg | Freq: Four times a day (QID) | INTRAMUSCULAR | Status: DC | PRN

## 2012-02-01 MED ORDER — BENAZEPRIL HCL 20 MG PO TABS
20.0000 mg | ORAL_TABLET | Freq: Every day | ORAL | Status: DC
  Filled 2012-02-01: qty 1

## 2012-02-01 MED ORDER — ACETAMINOPHEN 325 MG PO TABS: 650.0000 mg | ORAL_TABLET | Freq: Four times a day (QID) | ORAL | Status: DC | PRN

## 2012-02-01 MED ORDER — IRBESARTAN 75 MG PO TABS
75.0000 mg | ORAL_TABLET | Freq: Every day | ORAL | Status: DC
  Filled 2012-02-01: qty 1

## 2012-02-01 MED ORDER — CEFAZOLIN SODIUM-DEXTROSE 2-3 GM-% IV SOLR
2.0000 g | INTRAVENOUS | Status: AC
  Administered 2012-02-01: 2 g via INTRAVENOUS

## 2012-02-01 MED ORDER — METOPROLOL TARTRATE 25 MG PO TABS
25.0000 mg | ORAL_TABLET | Freq: Once | ORAL | Status: AC
  Administered 2012-02-01: 25 mg via ORAL
  Filled 2012-02-01: qty 1

## 2012-02-01 MED ORDER — BACITRACIN ZINC 500 UNIT/GM EX OINT
TOPICAL_OINTMENT | CUTANEOUS | Status: DC | PRN
  Administered 2012-02-01: 1 via TOPICAL

## 2012-02-01 MED ORDER — INSULIN ASPART 100 UNIT/ML ~~LOC~~ SOLN: 0.0000 [IU] | Freq: Three times a day (TID) | SUBCUTANEOUS | Status: DC

## 2012-02-01 MED ORDER — LIDOCAINE-EPINEPHRINE 1 %-1:100000 IJ SOLN
INTRAMUSCULAR | Status: DC | PRN
  Administered 2012-02-01: 8.5 mL

## 2012-02-01 MED ORDER — AMLODIPINE BESYLATE 5 MG PO TABS
5.0000 mg | ORAL_TABLET | Freq: Every day | ORAL | Status: DC
Start: 2012-02-01 — End: 2012-02-02
  Administered 2012-02-01: 5 mg via ORAL
  Filled 2012-02-01 (×2): qty 1

## 2012-02-01 MED ORDER — LIDOCAINE-EPINEPHRINE 1 %-1:100000 IJ SOLN
INTRAMUSCULAR | Status: AC
  Filled 2012-02-01: qty 1

## 2012-02-01 MED ORDER — BUPIVACAINE HCL 0.25 % IJ SOLN
INTRAMUSCULAR | Status: DC | PRN
  Administered 2012-02-01: 8.5 mL

## 2012-02-01 SURGICAL SUPPLY — 42 items
APL SKNCLS STERI-STRIP NONHPOA (GAUZE/BANDAGES/DRESSINGS)
BANDAGE ELASTIC 6 VELCRO ST LF (GAUZE/BANDAGES/DRESSINGS) IMPLANT
BANDAGE GAUZE ELAST BULKY 4 IN (GAUZE/BANDAGES/DRESSINGS) ×1 IMPLANT
BENZOIN TINCTURE PRP APPL 2/3 (GAUZE/BANDAGES/DRESSINGS) ×1 IMPLANT
BLADE HEX COATED 2.75 (ELECTRODE) ×2 IMPLANT
BLADE SURG 15 STRL LF DISP TIS (BLADE) ×3 IMPLANT
BLADE SURG 15 STRL SS (BLADE) ×2
CANISTER SUCTION 2500CC (MISCELLANEOUS) ×2 IMPLANT
CHLORAPREP W/TINT 26ML (MISCELLANEOUS) ×1 IMPLANT
CLOTH BEACON ORANGE TIMEOUT ST (SAFETY) ×2 IMPLANT
COVER SURGICAL LIGHT HANDLE (MISCELLANEOUS) ×2 IMPLANT
DECANTER SPIKE VIAL GLASS SM (MISCELLANEOUS) ×2 IMPLANT
DRAPE LAPAROSCOPIC ABDOMINAL (DRAPES) ×2 IMPLANT
DRSG TEGADERM 4X4.75 (GAUZE/BANDAGES/DRESSINGS) ×1 IMPLANT
ELECT REM PT RETURN 9FT ADLT (ELECTROSURGICAL) ×2
ELECTRODE REM PT RTRN 9FT ADLT (ELECTROSURGICAL) ×1 IMPLANT
GAUZE SPONGE 4X4 16PLY XRAY LF (GAUZE/BANDAGES/DRESSINGS) ×2 IMPLANT
GLOVE BIO SURGEON STRL SZ7 (GLOVE) ×2 IMPLANT
GLOVE BIOGEL PI IND STRL 7.0 (GLOVE) ×1 IMPLANT
GLOVE BIOGEL PI IND STRL 7.5 (GLOVE) ×1 IMPLANT
GLOVE BIOGEL PI INDICATOR 7.0 (GLOVE) ×1
GLOVE BIOGEL PI INDICATOR 7.5 (GLOVE) ×1
GOWN PREVENTION PLUS LG XLONG (DISPOSABLE) ×2 IMPLANT
GOWN PREVENTION PLUS XLARGE (GOWN DISPOSABLE) ×1 IMPLANT
GOWN STRL NON-REIN LRG LVL3 (GOWN DISPOSABLE) ×2 IMPLANT
GOWN STRL REIN XL XLG (GOWN DISPOSABLE) ×2 IMPLANT
KIT BASIN OR (CUSTOM PROCEDURE TRAY) ×2 IMPLANT
NDL HYPO 25X1 1.5 SAFETY (NEEDLE) ×1 IMPLANT
NEEDLE HYPO 22GX1.5 SAFETY (NEEDLE) IMPLANT
NEEDLE HYPO 25X1 1.5 SAFETY (NEEDLE) ×2 IMPLANT
NS IRRIG 1000ML POUR BTL (IV SOLUTION) ×2 IMPLANT
PACK BASIC VI WITH GOWN DISP (CUSTOM PROCEDURE TRAY) ×2 IMPLANT
PEN SKIN MARKING BROAD (MISCELLANEOUS) ×2 IMPLANT
PENCIL BUTTON HOLSTER BLD 10FT (ELECTRODE) ×2 IMPLANT
SPONGE GAUZE 4X4 12PLY (GAUZE/BANDAGES/DRESSINGS) ×1 IMPLANT
STRIP CLOSURE SKIN 1/2X4 (GAUZE/BANDAGES/DRESSINGS) ×1 IMPLANT
SUT VIC AB 3-0 SH 27 (SUTURE)
SUT VIC AB 3-0 SH 27XBRD (SUTURE) ×1 IMPLANT
SYR BULB IRRIGATION 50ML (SYRINGE) IMPLANT
SYR CONTROL 10ML LL (SYRINGE) ×2 IMPLANT
TOWEL OR 17X26 10 PK STRL BLUE (TOWEL DISPOSABLE) ×2 IMPLANT
YANKAUER SUCT BULB TIP 10FT TU (MISCELLANEOUS) IMPLANT

## 2012-02-01 NOTE — H&P (View-Only) (Signed)
Patient ID: Latasha Proctor, female   DOB: 05/06/1917, 78 y.o.   MRN: [id number]  Chief Complaint  Patient presents with  . Pre-op Exam    ue melanoma in situ    HPI Latasha Proctor is a 78 y.o. female.  Consult from Dr. Lupton HPI This is a 78-year-old female has a history of diabetes and hypertension. She is otherwise fairly healthy. She went after sure her daughter noticed a mass on her back. This has been biopsied in the squamous cell carcinoma in situ. She was also noted at that time to have some discoloration as well as a lesion on her left upper extremity. This had been there for some time. This underwent a shave biopsy shows melanoma in situ, lentigo maligna type. She comes in today to discuss her treatment options for this. She has no real complaints about this area at all. Past Medical History  Diagnosis Date  . Arthritis   . Diabetes mellitus   . Hyperlipidemia   . Hypertension     Past Surgical History  Procedure Date  . Ankle surgery   . Back     Family History  Problem Relation Age of Onset  . Diabetes Mother   . Stroke Father     Social History History  Substance Use Topics  . Smoking status: Former Smoker  . Smokeless tobacco: Not on file   Comment: quit over 30 years ago  . Alcohol Use: 0.0 oz/week     occ    No Known Allergies  Current Outpatient Prescriptions  Medication Sig Dispense Refill  . amLODipine (NORVASC) 5 MG tablet       . aspirin 81 MG tablet Take 81 mg by mouth daily.      . benazepril (LOTENSIN) 20 MG tablet       . DIOVAN 320 MG tablet       . GLIPIZIDE XL 5 MG 24 hr tablet       . metFORMIN (GLUCOPHAGE) 1000 MG tablet       . Multiple Vitamins-Minerals (MULTIVITAMIN WITH MINERALS) tablet Take 1 tablet by mouth daily.      . naproxen sodium (ANAPROX) 220 MG tablet Take 220 mg by mouth 2 (two) times daily with a meal.      . ONGLYZA 5 MG TABS tablet         Review of Systems Review of Systems  Constitutional: Negative for fever,  chills and unexpected weight change.  HENT: Positive for hearing loss. Negative for congestion, sore throat, trouble swallowing and voice change.   Eyes: Negative for visual disturbance.  Respiratory: Negative for cough and wheezing.   Cardiovascular: Negative for chest pain, palpitations and leg swelling.  Gastrointestinal: Negative for nausea, vomiting, abdominal pain, diarrhea, constipation, blood in stool, abdominal distention and anal bleeding.  Genitourinary: Negative for hematuria, vaginal bleeding and difficulty urinating.  Musculoskeletal: Negative for arthralgias.  Skin: Negative for rash and wound.  Neurological: Negative for seizures, syncope and headaches.  Hematological: Negative for adenopathy. Does not bruise/bleed easily.  Psychiatric/Behavioral: Negative for confusion.    Blood pressure 162/70, pulse 96, temperature 98.4 F (36.9 C), temperature source Temporal, resp. rate 16, height 5' 4" (1.626 m), weight 171 lb 12.8 oz (77.928 kg).  Physical Exam Physical Exam  Vitals reviewed. Constitutional: She appears well-developed and well-nourished.  Cardiovascular: Normal rate, regular rhythm and normal heart sounds.   Pulmonary/Chest: Effort normal and breath sounds normal. She has no wheezes. She has no rales.  Musculoskeletal:         Arms:   Data Reviewed Path and notes from Dr. Luptons office  Assessment    Left upper extremity melanoma in situ    Plan    We discussed wide local excision. I discussed wide local excision with margins. We discussed possible positive margins after excision. We discussed keeping her in the hospital overnight due to were advanced age as well as the fact that she will need some help after this as well. I discussed with her the risks of bleeding, seroma, infection. We discussed the stitches may be on the outside as well. We discussed observation as a treatment for this is not really a viable option given the fact that this is an early  melanoma. She understands the need for surgery we will plan on proceeding as soon as possible.       Kaeli Nichelson 01/24/2012, 3:03 PM    

## 2012-02-01 NOTE — Op Note (Signed)
Preoperative diagnosis: Left upper extremity melanoma in situ Postoperative diagnosis: Same as above Procedure: Wide local excision of left upper extremity melanoma Surgeon: Dr. Harden Mo Anesthesia: Gen. With LMA Specimens: Left arm melanoma wide local excision with short superior, long stitch lateral, double stitch deep Estimated blood loss: Minimal Complications: Atrial fibrillation, rate controlled Drains: None Sponge and needle count was correct x2 at end of operation Disposition to recovery stable  Indications: This a 78 year old female with a history of paroxysmal atrial fibrillation, hypertension, diabetes who presents with a lacerated left arm lesion. This underwent biopsy by Dr. Terri Piedra which shows this to be a melanoma in situ. Due to her symptoms and the fact this is a melanoma she wanted this excised. We discussed wide local excision.  Procedure:After informed consent was obtained from the patient she was taken to the operating room. She was administered 2 g of intravenous cefazolin. Sequential compression devices were placed on her legs.She was then placed under general anesthesia with an LMA. Her left arm was prepped and draped in the standard sterile surgical fashion. Surgical timeout was then performed.  I marked out a centimeter and a half margins in all directions from her shave biopsy site. This gave the total excision site to be 5 x 12 cm.I then made an elliptical incision and used cautery to excise this tissue all the way down to her fascia. This was then marked as above. Her skin was very loose and closed easily. I closed the dermis with 3-0 Vicryl. I elected not to place a subcuticular suture due to the condition of her skin. I placed a numerous 3-0 nylon interrupted sutures to close this. This was hemostatic upon completion. I placed bacitracin and a sterile dressing over this. I wrapped her arm. She tolerated this well was extubated and transferred to recovery stable. I  left cardiology see her for atrial fibrillation just to make sure she states controlled and monitor on telemetry overnight.

## 2012-02-01 NOTE — Anesthesia Postprocedure Evaluation (Signed)
Anesthesia Post Note  Patient: Latasha Proctor  Procedure(s) Performed: Procedure(s) (LRB): MELANOMA EXCISION (Left)  Anesthesia type: General  Patient location: PACU  Post pain: Pain level controlled  Post assessment: Post-op Vital signs reviewed  Last Vitals: BP 183/72  Pulse 119  Temp 36.8 C (Oral)  Resp 20  Ht 5\' 3"  (1.6 m)  Wt 170 lb (77.11 kg)  BMI 30.11 kg/m2  SpO2 96%  Post vital signs: Reviewed  Level of consciousness: sedated  Complications: No apparent anesthesia complications

## 2012-02-01 NOTE — Transfer of Care (Signed)
Immediate Anesthesia Transfer of Care Note  Patient: Latasha Proctor  Procedure(s) Performed: Procedure(s) (LRB) with comments: MELANOMA EXCISION (Left) - wide excision left arm melanoma  Patient Location: PACU  Anesthesia Type: General  Level of Consciousness: awake, oriented and patient cooperative  Airway & Oxygen Therapy: Patient Spontanous Breathing and Patient connected to face mask oxygen  Post-op Assessment: Report given to PACU RN and Post -op Vital signs reviewed and stable  Post vital signs: Reviewed and stable  Complications: No apparent anesthesia complications

## 2012-02-01 NOTE — Interval H&P Note (Signed)
History and Physical Interval Note:  02/01/2012 11:30 AM  Latasha Proctor Latasha Proctor  has presented today for surgery, with the diagnosis of melanoma in situ  The various methods of treatment have been discussed with the patient and family. After consideration of risks, benefits and other options for treatment, the patient has consented to  Procedure(s) (LRB) with comments: MELANOMA EXCISION (Left) as a surgical intervention .  The patient's history has been reviewed, patient examined, no change in status, stable for surgery.  I have reviewed the patient's chart and labs.  Questions were answered to the patient's satisfaction.     Dan Scearce

## 2012-02-01 NOTE — Progress Notes (Signed)
Received from PACU, alert and oriented, 0/10 pain in left bicep area, dressing dry and intact.  Assisted to bathroom voided large amount urine, tolerated ambulation well.  Instructed on how to order her meals, given apple juice po.  Iv infusing of NS at 50/hour in right forearm.

## 2012-02-01 NOTE — Anesthesia Preprocedure Evaluation (Addendum)
Anesthesia Evaluation  Patient identified by MRN, date of birth, ID band Patient awake    Reviewed: Allergy & Precautions, H&P , NPO status , Patient's Chart, lab work & pertinent test results  Airway Mallampati: II TM Distance: >3 FB Neck ROM: Full    Dental  (+) Teeth Intact, Poor Dentition, Chipped and Dental Advisory Given   Pulmonary neg pulmonary ROS,  breath sounds clear to auscultation  Pulmonary exam normal       Cardiovascular hypertension, Pt. on medications + dysrhythmias Atrial Fibrillation Rhythm:Irregular Rate:Tachycardia     Neuro/Psych negative neurological ROS  negative psych ROS   GI/Hepatic negative GI ROS, Neg liver ROS,   Endo/Other  diabetes, Type 2, Oral Hypoglycemic Agents  Renal/GU Renal InsufficiencyRenal disease     Musculoskeletal negative musculoskeletal ROS (+)   Abdominal (+) + obese,   Peds  Hematology negative hematology ROS (+)   Anesthesia Other Findings   Reproductive/Obstetrics                       Anesthesia Physical Anesthesia Plan  ASA: III  Anesthesia Plan: General   Post-op Pain Management:    Induction: Intravenous  Airway Management Planned: LMA  Additional Equipment:   Intra-op Plan:   Post-operative Plan: Extubation in OR  Informed Consent: I have reviewed the patients History and Physical, chart, labs and discussed the procedure including the risks, benefits and alternatives for the proposed anesthesia with the patient or authorized representative who has indicated his/her understanding and acceptance.   Dental advisory given  Plan Discussed with: CRNA and Surgeon  Anesthesia Plan Comments: (Pt in A fib with RVR. Per daughter pt had in afib with prior hospitalization. No tx other than low dose ASA. Discussed with pt,family and Dr. Dwain Sarna. Plan to proceed and treat rate as needed. BP stable.)       Anesthesia Quick  Evaluation

## 2012-02-01 NOTE — Consult Note (Signed)
Reason for Consult: Afib  Referring Physician:  [name] is an 78 y.o. female.  HPI:   The patient is a 78 yo female with a history of PAF, DMII, HTN, macular degeneration.  It was decided in the past not to anticoagulate with warfarin and to only use ASA. During a previous hospitalization prior to Feb. 2012, secondary to fall and rhabdo, the patient was in Afib and spontaneously converted to NSR with Cartia and lopressor.  She just underwent excision of a left upper extremity melanoma.  She is currently in Afib with RVR.  HR ~110-128bpm and asymptomatic.  BP has been as high as 215/93 and last was 183/72.  She has not seen Dr. Herbie Proctor since Feb 2012, however, she sees Dr. Evlyn Proctor regularly.  Both lopressor and Cartia have been DCd at some point.   She denies N, V, Fever, acute vision change, CP, SOB, orthopnea, PND, ABD pain, dysuria, hematuria, constipation, diarrhea.  She has a little right LEE.   Past Medical History  Diagnosis Date  . Arthritis   . Diabetes mellitus   . Hyperlipidemia   . Hypertension   . Dysrhythmia     hx of atrial fib 2/13   . Macular degeneration   . Chronic kidney disease     stress incontinence , wears depends    Past Surgical History  Procedure Date  . Ankle surgery   . Back   . Abdominal hysterectomy     Family History  Problem Relation Age of Onset  . Diabetes Mother   . Stroke Father     Social History:  reports that she has quit smoking. She has never used smokeless tobacco. She reports that she drinks alcohol. She reports that she does not use illicit drugs.  Allergies: No Known Allergies  Medications:     . amLODipine  5 mg Oral Daily  . aspirin  81 mg Oral Daily  . benazepril  20 mg Oral Daily  .  ceFAZolin (ANCEF) IV  2 g Intravenous 60 min Pre-Op  . glipiZIDE  5 mg Oral Q breakfast  . insulin aspart  0-9 Units Subcutaneous TID WC  . irbesartan  75 mg Oral Daily  . DISCONTD: aspirin  81 mg Oral Q breakfast      Results for orders placed during the hospital encounter of 02/01/12 (from the past 48 hour(s))  GLUCOSE, CAPILLARY     Status: Abnormal   Collection Time   02/01/12  9:37 AM      Component Value Range Comment   Glucose-Capillary 114 (*) 70 - 99 mg/dL     Dg Chest 2 View  08/09/8117  *RADIOLOGY REPORT*  Clinical Data: Preop, left upper arm melanoma  CHEST - 2 VIEW  Comparison: 05/27/2010  Findings: Cardiomegaly again noted.  Stable mild elevation of the right hemidiaphragm.  Metallic fixation rods mid thoracic spine again noted.  Stable mild degenerative changes lower thoracic spine.  No acute infiltrate or pleural effusion.  Again noted degenerative changes bilateral shoulders right greater than left.  IMPRESSION: No active disease.  No significant change.   Original Report Authenticated By: Natasha Mead, M.D.     Review of Systems  Constitutional: Negative for fever and diaphoresis.  HENT: Negative for congestion and sore throat.   Eyes: Negative for blurred vision and double vision.  Respiratory: Negative for shortness of breath.   Cardiovascular: Positive for leg swelling (right). Negative for chest pain, orthopnea and PND.  Gastrointestinal: Negative for  nausea, vomiting, abdominal pain, diarrhea, constipation and blood in stool.  Genitourinary: Negative for dysuria and hematuria.  Neurological: Negative for dizziness.   Blood pressure 183/72, pulse 119, temperature 98.2 F (36.8 C), temperature source Oral, resp. rate 20, height 5\' 3"  (1.6 m), weight 77.11 kg (170 lb), SpO2 96.00%. Physical Exam  Constitutional: She is oriented to person, place, and time. She appears well-developed. No distress.  HENT:  Head: Normocephalic and atraumatic.  Eyes: EOM are normal. Pupils are equal, round, and reactive to light.  Neck: Normal range of motion.  Cardiovascular: An irregularly irregular rhythm present. Tachycardia present.   Murmur heard.  Systolic murmur is present with a grade of  2/6  Pulses:      Radial pulses are 2+ on the right side, and 2+ on the left side.       Dorsalis pedis pulses are 1+ on the right side, and 1+ on the left side.       No Carotid Bruit.  Respiratory: Effort normal and breath sounds normal. She has no wheezes. She has no rales.  GI: Soft. Bowel sounds are normal. There is no tenderness.  Musculoskeletal: She exhibits edema.       1+ right LEE   Neurological: She is alert and oriented to person, place, and time. She exhibits normal muscle tone.  Skin: Skin is warm and dry.  Psychiatric: She has a normal mood and affect.    Assessment/Plan: Patient Active Hospital Problem List: No active hospital problems. 1. Afib RVR 2. HTN 3. DM2 4. Melanoma 5. Macular Degeneration  Plan:  BP is poorly controlled. The patient attributes it to white coat syndrome, but I disagree.  She is asymptomatic from the afib but HR is 128 while resting in the chair.  She likely will convert to NSR at some point as she flips back and forth.  Yesterday she was in NSR.  No anticoagulation beyond ASA per previous discussions with the patient.  Will add 25mg  of lopressor BID.  She is already on ACE, ARB and amlodipine.  HAGER, BRYAN 02/01/2012, 3:10 PM      Agree with note written by Jones Skene PAC  PAR with RVR. Asymptomatic. S/P resection of melanoma. SBP increased. Will adjust meds, add BB. Exam benign. Not a good coumadin candidate. Prob home in AM.  Nanetta Batty J 02/01/2012 4:28 PM

## 2012-02-02 ENCOUNTER — Encounter (HOSPITAL_COMMUNITY): Payer: Self-pay | Admitting: General Surgery

## 2012-02-02 MED ORDER — METOPROLOL TARTRATE 25 MG PO TABS: 25.0000 mg | ORAL_TABLET | Freq: Two times a day (BID) | ORAL | Status: DC

## 2012-02-02 NOTE — Discharge Summary (Signed)
Physician Discharge Summary  Patient ID: EZORA THIGPEN MRN: [id number] DOB/AGE: Jul 01, 1917 78 y.o.  Admit date: 02/01/2012 Discharge date: 02/02/2012  Admission Diagnoses: Atrial fibrillation Melanoma in situ of RUE Hypertension Diabetes  Discharge Diagnoses:  Same as above  Discharged Condition: good  Hospital Course: 11 yof with symptomatic lesion of right upper extremity that underwent biopsy showing melanoma in situ. She was taken to or for wide local excision which went well.  She has been in atrial fibrillation around surgery but rate controlled.  She was seen by cardiology and they added beta blockade.  She will be discharged home today.  Consults: cardiology  Significant Diagnostic Studies: none  Treatments: surgery: WLE right upper extremity melanoma   Disposition: 01-Home or Self Care  Discharge Orders    Future Appointments: Provider: Department: Dept Phone: Center:   02/21/2012 2:20 PM Emelia Loron, MD [facility] [phone] None       Medication List     As of 02/02/2012  2:44 PM    TAKE these medications         amLODipine 5 MG tablet   Commonly known as: NORVASC   Take 5 mg by mouth daily with breakfast.      aspirin 81 MG tablet   Take 81 mg by mouth daily with breakfast.      benazepril 20 MG tablet   Commonly known as: LOTENSIN   Take 20 mg by mouth daily with breakfast.      DIOVAN 320 MG tablet   Generic drug: valsartan   Take 320 mg by mouth daily with breakfast.      GLIPIZIDE XL 5 MG 24 hr tablet   Generic drug: glipiZIDE   Take 5 mg by mouth daily with breakfast.      metFORMIN 1000 MG tablet   Commonly known as: GLUCOPHAGE   Take 500 mg by mouth daily with breakfast.      metoprolol tartrate 25 MG tablet   Commonly known as: LOPRESSOR   Take 1 tablet (25 mg total) by mouth 2 (two) times daily.      multivitamin with minerals tablet   Take 1 tablet by mouth daily.      naproxen sodium 220 MG tablet   Commonly  known as: ANAPROX   Take 220 mg by mouth 2 (two) times daily with a meal.      ONGLYZA 5 MG Tabs tablet   Generic drug: saxagliptin HCl   Take 5 mg by mouth daily with breakfast.           Follow-up Information    Follow up with Emelia Loron, MD. On 02/21/2012.   Contact information:   [address] [place] [address] [phone]          Signed: Emelia Loron 02/02/2012, 2:44 PM

## 2012-02-02 NOTE — Progress Notes (Signed)
1 Day Post-Op  Subjective: No complaints  Objective: Vital signs in last 24 hours: Temp:  [98 F (36.7 C)-98.2 F (36.8 C)] 98.1 F (36.7 C) (10/02 0647) Pulse Rate:  [84-119] 85  (10/02 0647) Resp:  [13-21] 20  (10/02 0647) BP: (133-186)/(60-88) 186/70 mmHg (10/02 0647) SpO2:  [94 %-100 %] 94 % (10/02 0647) Weight:  [170 lb (77.11 kg)] 170 lb (77.11 kg) (10/01 1435) Last BM Date: 02/01/12  Intake/Output from previous day: 10/01 0701 - 10/02 0700 In: 720 [P.O.:120; I.V.:600] Out: -  Intake/Output this shift:    Incision/Wound:clean without infection   Lab Results:   Basename 01/31/12 0900  WBC 4.5  HGB 11.0*  HCT 34.9*  PLT 234   BMET  Basename 01/31/12 0900  NA 139  K 4.8  CL 104  CO2 29  GLUCOSE 129*  BUN 19  CREATININE 1.01  CALCIUM 9.0   Assessment/Plan: s/p Procedure(s) (LRB) with comments: MELANOMA EXCISION (Left) - wide excision left arm melanoma dc home today on lopressor per cards F/u with me 3 weeks to remove stitches  LOS: 1 day    [facility] 02/02/2012

## 2012-02-02 NOTE — Significant Event (Signed)
Doctor on call notified for BP 186/70... Said to notify oncoming doc who comes on in 3 min.Marland KitchenMarland Kitchen

## 2012-02-02 NOTE — Significant Event (Signed)
IV to NSL per pt. request

## 2012-02-04 ENCOUNTER — Telehealth (INDEPENDENT_AMBULATORY_CARE_PROVIDER_SITE_OTHER): Payer: Self-pay | Admitting: General Surgery

## 2012-02-04 NOTE — Telephone Encounter (Signed)
Spoke with pt and informed her that we got all of her melanoma in the bx.  She was pleased with this.

## 2012-02-21 ENCOUNTER — Ambulatory Visit (INDEPENDENT_AMBULATORY_CARE_PROVIDER_SITE_OTHER): Payer: Medicare Other | Admitting: General Surgery

## 2012-02-21 ENCOUNTER — Encounter (INDEPENDENT_AMBULATORY_CARE_PROVIDER_SITE_OTHER): Payer: Self-pay | Admitting: General Surgery

## 2012-02-21 VITALS — BP 132/86 | HR 72 | Temp 98.9°F | Resp 18 | Ht 63.0 in | Wt 170.0 lb

## 2012-02-21 DIAGNOSIS — Z09 Encounter for follow-up examination after completed treatment for conditions other than malignant neoplasm: Secondary | ICD-10-CM

## 2012-02-21 NOTE — Progress Notes (Signed)
Subjective:     Patient ID: Latasha Proctor, female   DOB: 02-Feb-1918, 78 y.o.   MRN: [id number]  HPI  This is a 78 year old female was diagnosed with a melanoma in situ of her left upper extremity. She then underwent wide local excision of this area with pathology showing no residual melanoma in situ increasing the margins are free as well. She returns today doing well. We discussed her pathology she presents for stitch removal. Review of Systems     Objective:   Physical Exam Incision clean without infection. I removed the stitches and place Steri-Strips today.    Assessment:     Status post wide local excision of melanoma in situ left arm    Plan:     I told her Steri-Strips to fall off in the next couple weeks. She is otherwise doing well. I asked her to come back and see me as needed.

## 2013-08-31 HISTORY — PX: OTHER SURGICAL HISTORY: SHX169

## 2013-08-31 HISTORY — PX: TRANSTHORACIC ECHOCARDIOGRAM: SHX275

## 2013-09-19 ENCOUNTER — Other Ambulatory Visit: Payer: Self-pay | Admitting: Endocrinology

## 2013-09-19 ENCOUNTER — Inpatient Hospital Stay (HOSPITAL_COMMUNITY)
Admission: AD | Admit: 2013-09-19 | Discharge: 2013-09-25 | DRG: 308 | Disposition: A | Discharge status: Skilled Nursing Facility | Payer: Medicare Other | Source: Ambulatory Visit | Attending: Endocrinology | Admitting: Endocrinology

## 2013-09-19 ENCOUNTER — Inpatient Hospital Stay (HOSPITAL_COMMUNITY): Payer: Medicare Other

## 2013-09-19 ENCOUNTER — Encounter (HOSPITAL_COMMUNITY): Payer: Self-pay | Admitting: *Deleted

## 2013-09-19 DIAGNOSIS — I4891 Unspecified atrial fibrillation: Principal | ICD-10-CM | POA: Diagnosis present

## 2013-09-19 DIAGNOSIS — I509 Heart failure, unspecified: Secondary | ICD-10-CM | POA: Diagnosis present

## 2013-09-19 DIAGNOSIS — J690 Pneumonitis due to inhalation of food and vomit: Secondary | ICD-10-CM | POA: Diagnosis present

## 2013-09-19 DIAGNOSIS — E1129 Type 2 diabetes mellitus with other diabetic kidney complication: Secondary | ICD-10-CM | POA: Diagnosis present

## 2013-09-19 DIAGNOSIS — H3530 Unspecified macular degeneration: Secondary | ICD-10-CM | POA: Diagnosis present

## 2013-09-19 DIAGNOSIS — J90 Pleural effusion, not elsewhere classified: Secondary | ICD-10-CM | POA: Diagnosis present

## 2013-09-19 DIAGNOSIS — D509 Iron deficiency anemia, unspecified: Secondary | ICD-10-CM | POA: Diagnosis present

## 2013-09-19 DIAGNOSIS — Z87891 Personal history of nicotine dependence: Secondary | ICD-10-CM

## 2013-09-19 DIAGNOSIS — M949 Disorder of cartilage, unspecified: Secondary | ICD-10-CM

## 2013-09-19 DIAGNOSIS — N393 Stress incontinence (female) (male): Secondary | ICD-10-CM | POA: Diagnosis present

## 2013-09-19 DIAGNOSIS — I5032 Chronic diastolic (congestive) heart failure: Secondary | ICD-10-CM | POA: Diagnosis present

## 2013-09-19 DIAGNOSIS — I2789 Other specified pulmonary heart diseases: Secondary | ICD-10-CM | POA: Diagnosis present

## 2013-09-19 DIAGNOSIS — E785 Hyperlipidemia, unspecified: Secondary | ICD-10-CM | POA: Diagnosis present

## 2013-09-19 DIAGNOSIS — D696 Thrombocytopenia, unspecified: Secondary | ICD-10-CM | POA: Diagnosis present

## 2013-09-19 DIAGNOSIS — Z823 Family history of stroke: Secondary | ICD-10-CM

## 2013-09-19 DIAGNOSIS — R634 Abnormal weight loss: Secondary | ICD-10-CM | POA: Diagnosis present

## 2013-09-19 DIAGNOSIS — I129 Hypertensive chronic kidney disease with stage 1 through stage 4 chronic kidney disease, or unspecified chronic kidney disease: Secondary | ICD-10-CM | POA: Diagnosis present

## 2013-09-19 DIAGNOSIS — R079 Chest pain, unspecified: Secondary | ICD-10-CM | POA: Diagnosis present

## 2013-09-19 DIAGNOSIS — K7689 Other specified diseases of liver: Secondary | ICD-10-CM | POA: Diagnosis present

## 2013-09-19 DIAGNOSIS — N189 Chronic kidney disease, unspecified: Secondary | ICD-10-CM | POA: Diagnosis present

## 2013-09-19 DIAGNOSIS — N058 Unspecified nephritic syndrome with other morphologic changes: Secondary | ICD-10-CM | POA: Diagnosis present

## 2013-09-19 DIAGNOSIS — Z66 Do not resuscitate: Secondary | ICD-10-CM | POA: Diagnosis present

## 2013-09-19 DIAGNOSIS — Z833 Family history of diabetes mellitus: Secondary | ICD-10-CM

## 2013-09-19 DIAGNOSIS — Z7982 Long term (current) use of aspirin: Secondary | ICD-10-CM

## 2013-09-19 DIAGNOSIS — M899 Disorder of bone, unspecified: Secondary | ICD-10-CM | POA: Diagnosis present

## 2013-09-19 DIAGNOSIS — E43 Unspecified severe protein-calorie malnutrition: Secondary | ICD-10-CM | POA: Diagnosis present

## 2013-09-19 DIAGNOSIS — M129 Arthropathy, unspecified: Secondary | ICD-10-CM | POA: Diagnosis present

## 2013-09-19 LAB — COMPREHENSIVE METABOLIC PANEL
ALBUMIN: 3.6 g/dL (ref 3.5–5.2)
ALT: 10 U/L (ref 0–35)
AST: 17 U/L (ref 0–37)
Alkaline Phosphatase: 61 U/L (ref 39–117)
BILIRUBIN TOTAL: 0.8 mg/dL (ref 0.3–1.2)
BUN: 27 mg/dL — ABNORMAL HIGH (ref 6–23)
CALCIUM: 9.3 mg/dL (ref 8.4–10.5)
CHLORIDE: 96 meq/L (ref 96–112)
CO2: 30 mEq/L (ref 19–32)
CREATININE: 1.06 mg/dL (ref 0.50–1.10)
GFR calc Af Amer: 51 mL/min — ABNORMAL LOW (ref >90)
GFR calc non Af Amer: 44 mL/min — ABNORMAL LOW (ref >90)
Glucose, Bld: 100 mg/dL — ABNORMAL HIGH (ref 70–99)
Potassium: 4.3 mEq/L (ref 3.7–5.3)
Sodium: 140 mEq/L (ref 137–147)
Total Protein: 6.5 g/dL (ref 6.0–8.3)

## 2013-09-19 LAB — CBC
HEMATOCRIT: 37.9 % (ref 36.0–46.0)
HEMOGLOBIN: 11.7 g/dL — AB (ref 12.0–15.0)
MCH: 26.1 pg (ref 26.0–34.0)
MCHC: 30.9 g/dL (ref 30.0–36.0)
MCV: 84.6 fL (ref 78.0–100.0)
Platelets: 158 10*3/uL (ref 150–400)
RBC: 4.48 MIL/uL (ref 3.87–5.11)
RDW: 15.6 % — ABNORMAL HIGH (ref 11.5–15.5)
WBC: 7.1 10*3/uL (ref 4.0–10.5)

## 2013-09-19 LAB — PRO B NATRIURETIC PEPTIDE: PRO B NATRI PEPTIDE: 4537 pg/mL — AB (ref 0–450)

## 2013-09-19 LAB — TSH: TSH: 2.13 u[IU]/mL (ref 0.350–4.500)

## 2013-09-19 LAB — TROPONIN I
Troponin I: <0.3 ng/mL (ref <0.30)
Troponin I: <0.3 ng/mL (ref <0.30)

## 2013-09-19 LAB — GLUCOSE, CAPILLARY: Glucose-Capillary: 108 mg/dL — ABNORMAL HIGH (ref 70–99)

## 2013-09-19 LAB — CORTISOL: Cortisol, Plasma: 16 ug/dL

## 2013-09-19 LAB — T4, FREE: Free T4: 1.08 ng/dL (ref 0.80–1.80)

## 2013-09-19 MED ORDER — INSULIN ASPART 100 UNIT/ML ~~LOC~~ SOLN
0.0000 [IU] | Freq: Every day | SUBCUTANEOUS | Status: DC
  Administered 2013-09-23: 2 [IU] via SUBCUTANEOUS

## 2013-09-19 MED ORDER — IOHEXOL 300 MG/ML  SOLN
80.0000 mL | Freq: Once | INTRAMUSCULAR | Status: AC | PRN
Start: 2013-09-19 — End: 2013-09-19
  Administered 2013-09-19: 80 mL via INTRAVENOUS

## 2013-09-19 MED ORDER — ENOXAPARIN SODIUM 30 MG/0.3ML ~~LOC~~ SOLN
30.0000 mg | SUBCUTANEOUS | Status: DC
  Administered 2013-09-19: 30 mg via SUBCUTANEOUS
  Filled 2013-09-19: qty 0.3

## 2013-09-19 MED ORDER — INSULIN ASPART 100 UNIT/ML ~~LOC~~ SOLN
3.0000 [IU] | Freq: Three times a day (TID) | SUBCUTANEOUS | Status: DC
  Administered 2013-09-20 – 2013-09-24 (×3): 3 [IU] via SUBCUTANEOUS

## 2013-09-19 MED ORDER — INSULIN ASPART 100 UNIT/ML ~~LOC~~ SOLN
0.0000 [IU] | Freq: Three times a day (TID) | SUBCUTANEOUS | Status: DC
  Administered 2013-09-20: 1 [IU] via SUBCUTANEOUS
  Administered 2013-09-21: 3 [IU] via SUBCUTANEOUS
  Administered 2013-09-23 – 2013-09-24 (×2): 1 [IU] via SUBCUTANEOUS
  Administered 2013-09-24 – 2013-09-25 (×2): 2 [IU] via SUBCUTANEOUS

## 2013-09-19 MED ORDER — IOHEXOL 300 MG/ML  SOLN
25.0000 mL | INTRAMUSCULAR | Status: AC
  Administered 2013-09-19 (×2): 25 mL via ORAL

## 2013-09-19 MED ORDER — DILTIAZEM HCL 100 MG IV SOLR
5.0000 mg/h | INTRAVENOUS | Status: DC
  Administered 2013-09-19 – 2013-09-21 (×3): 5 mg/h via INTRAVENOUS
  Filled 2013-09-19 (×4): qty 100

## 2013-09-19 MED ORDER — HEPARIN BOLUS VIA INFUSION
2500.0000 [IU] | Freq: Once | INTRAVENOUS | Status: AC
  Administered 2013-09-19: 2500 [IU] via INTRAVENOUS
  Filled 2013-09-19: qty 2500

## 2013-09-19 MED ORDER — HEPARIN (PORCINE) IN NACL 100-0.45 UNIT/ML-% IJ SOLN
850.0000 [IU]/h | INTRAMUSCULAR | Status: AC
  Administered 2013-09-19: 750 [IU]/h via INTRAVENOUS
  Administered 2013-09-20 – 2013-09-21 (×2): 850 [IU]/h via INTRAVENOUS
  Filled 2013-09-19 (×3): qty 250

## 2013-09-19 NOTE — Progress Notes (Signed)
ANTICOAGULATION CONSULT NOTE - Initial Consult  Pharmacy Consult for heparin Indication: atrial fibrillation  No Known Allergies  Patient Measurements: Height: 5\' 3"  (160 cm) Weight: 114 lb 9.6 oz (51.982 kg) IBW/kg (Calculated) : 52.4   Vital Signs: Temp: 97.7 F (36.5 C) (05/20 1332) Temp src: Oral (05/20 1332) BP: 117/67 mmHg (05/20 1332) Pulse Rate: 127 (05/20 1332)  Labs:  Recent Labs  09/19/13 1515  HGB 11.7*  HCT 37.9  PLT 158  CREATININE 1.06    Estimated Creatinine Clearance: 27.2 ml/min (by C-G formula based on Cr of 1.06).   Medical History: Past Medical History  Diagnosis Date  . Arthritis   . Diabetes mellitus   . Hyperlipidemia   . Hypertension   . Dysrhythmia     hx of atrial fib 2/13   . Macular degeneration   . Chronic kidney disease     stress incontinence , wears depends     Assessment: 89yof admitted with A-Fib RVR 130s.  Heparin for anticoagulation was ordered.  CBC stable, Cr 1, no anticoagualtion prior to admission.    Goal of Therapy:  Heparin level 0.3-0.7 units/ml Monitor platelets by anticoagulation protocol: Yes   Plan:  Heparin bolus 2500 uts Iv x1 Heparin drip 750 uts/hr  Heparin level 6hr after start Daily HL, CBC  Bonnita Nasuti Pharm.D. CPP, BCPS Clinical Pharmacist [phone] 09/19/2013 6:47 PM

## 2013-09-19 NOTE — Consult Note (Signed)
CARDIOLOGY CONSULTATION NOTE.  NAME:  Latasha Proctor   MRN: [id number] DOB:  09-May-1917   ADMIT DATE: 09/19/2013  Reason for Consult: atrial fibrillation with RVR  Requesting Physician: Reynold Bowen, MD  Primary Cardiologist: Roni Bread., MD  HPI: This is a 80 y.o. female with a past medical history significant for diabetes type 2, HTN, macular degeneration and atrial fibrillation. We were asked to consult on her for evaluation of atrial fibrillation with RVR. She has a history of a-fib with RVR first diagnosed in 2012 while the patient was admitted to [facility] for rhabdomyolysis after a fall at the age of 57. An echocardiogram performed 05/28/10 showed mild LVH and normal EF. She returned to normal sinus rhythm and was discharged without anti-coagulation given her recent fall ansd subsequent risk. Thereafter, she was documented to have atrial fibrillation with RVR during a hospitalization for removal of a melanoma in 2013. Dr. Gwenlyn Found was consulted and recommended beta blocker and no anti-coagulation. The discharge summary states that the patient was discharged with metoprolol 25 mg BID, but the patient denies ever taking that. Since her last hospitalization, she has been in her usual states of health and living independently until about 1.5 months ago when she became more fatigued. This gradually worsened, until she was not leaving the house for the last 3 weeks. This was accompanied by shortness of breath with activity and decreased appetite. She denies any chest pain or increased swelling in her extremities, because she always has that at baseline.    Past Medical History  Diagnosis Date  . Arthritis   . Diabetes mellitus   . Hyperlipidemia   . Hypertension   . Dysrhythmia     hx of atrial fib 2/13   . Macular degeneration   . Chronic kidney disease     stress incontinence , wears depends   Past Surgical History  Procedure Laterality Date  . Ankle surgery    . Back    . Abdominal  hysterectomy    . Melanoma excision  02/01/2012    Procedure: MELANOMA EXCISION;  Surgeon: Rolm Bookbinder, MD;  Location: [facility]S;  Service: General;  Laterality: Left;  wide excision left arm melanoma    FAMHx: Family History  Problem Relation Age of Onset  . Diabetes Mother   . Stroke Father     SOCHx:  reports that she has quit smoking. She has never used smokeless tobacco. She reports that she does not drink alcohol or use illicit drugs.  ALLERGIES: No Known Allergies  ROS: A comprehensive review of systems was negative except for: fatigue, DOE, LOE edema, dysphagia  HOME MEDICATIONS: Prescriptions prior to admission  Medication Sig Dispense Refill  . amLODipine (NORVASC) 5 MG tablet Take 5 mg by mouth daily with breakfast.       . aspirin 81 MG tablet Take 81 mg by mouth daily with breakfast.       . benazepril (LOTENSIN) 20 MG tablet Take 20 mg by mouth daily with breakfast.       . DIOVAN 320 MG tablet Take 320 mg by mouth daily with breakfast.       . GLIPIZIDE XL 5 MG 24 hr tablet Take 5 mg by mouth daily with breakfast.       . metFORMIN (GLUCOPHAGE) 1000 MG tablet Take 500 mg by mouth daily with breakfast.       . metoprolol tartrate (LOPRESSOR) 25 MG tablet Take 1 tablet (25 mg total) by mouth 2 (  two) times daily.  28 tablet  0  . Multiple Vitamins-Minerals (MULTIVITAMIN WITH MINERALS) tablet Take 1 tablet by mouth daily.      . naproxen sodium (ANAPROX) 220 MG tablet Take 220 mg by mouth 2 (two) times daily with a meal.      . ONGLYZA 5 MG TABS tablet Take 5 mg by mouth daily with breakfast.       . valsartan-hydrochlorothiazide (DIOVAN-HCT) 320-12.5 MG per tablet         HOSPITAL MEDICATIONS: I have reviewed the patient's current medications.  VITALS: Blood pressure 117/67, pulse 127, temperature 97.7 F (36.5 C), temperature source Oral, resp. rate 18, height 5\' 3"  (1.6 m), weight 114 lb 9.6 oz (51.982 kg), SpO2 93.00%.  PHYSICAL EXAM: General appearance:  alert, cooperative, appears stated age and generally febile Lungs: clear to auscultation bilaterally and weak respiratory effort Heart: irregularly irregular rhythm Abdomen: soft, non-tender; bowel sounds normal; no masses,  no organomegaly Extremities: 2+ pedal edema of her feet, moderate non pitting edema of the ankles Skin: Skin color, texture, turgor normal. No rashes or lesions Neurologic: Mental status: Alert, oriented, thought content appropriate  LABS: Results for orders placed during the hospital encounter of 09/19/13 (from the past 24 hour(s))  COMPREHENSIVE METABOLIC PANEL     Status: Abnormal   Collection Time    09/19/13  3:15 PM      Result Value Ref Range   Sodium 140  137 - 147 mEq/L   Potassium 4.3  3.7 - 5.3 mEq/L   Chloride 96  96 - 112 mEq/L   CO2 30  19 - 32 mEq/L   Glucose, Bld 100 (*) 70 - 99 mg/dL   BUN 27 (*) 6 - 23 mg/dL   Creatinine, Ser 1.06  0.50 - 1.10 mg/dL   Calcium 9.3  8.4 - 10.5 mg/dL   Total Protein 6.5  6.0 - 8.3 g/dL   Albumin 3.6  3.5 - 5.2 g/dL   AST 17  0 - 37 U/L   ALT 10  0 - 35 U/L   Alkaline Phosphatase 61  39 - 117 U/L   Total Bilirubin 0.8  0.3 - 1.2 mg/dL   GFR calc non Af Amer 44 (*) >90 mL/min   GFR calc Af Amer 51 (*) >90 mL/min  CBC     Status: Abnormal   Collection Time    09/19/13  3:15 PM      Result Value Ref Range   WBC 7.1  4.0 - 10.5 K/uL   RBC 4.48  3.87 - 5.11 MIL/uL   Hemoglobin 11.7 (*) 12.0 - 15.0 g/dL   HCT 37.9  36.0 - 46.0 %   MCV 84.6  78.0 - 100.0 fL   MCH 26.1  26.0 - 34.0 pg   MCHC 30.9  30.0 - 36.0 g/dL   RDW 15.6 (*) 11.5 - 15.5 %   Platelets 158  150 - 400 K/uL    IMAGING: No results found.  IMPRESSION: Atrial Fibrillation with RVR   RECOMMENDATION: 1. Start rate control with Cardizem ggt 2. Start anticoagulation with heparin ggt 3. Follow up echo tomorrow    Marena Chancy. Maricela Bo, MD, MBA 09/19/2013, 5:07 PM Family Medicine Resident, PGY-3    Patient seen and examined. Agree with  assessment and plan. Very pleasant 80 yo WF who is admitted from Dr. Baldwin Crown office with [name]. Pt  Had a prior h/o of PAF 3 yrs ago. She has lost 50 pounds nonpurposefully over the  last 6 months. She admits to a significant change in fatigue over the past 3 weeks with significant exertional dyspnea suggesting the probabilty of AF for several weeks duration rather than onset today. ECG earlier has shown rate at 130, and current rate on telemetry is in the 130's.  Will start systemic heparin rather than DVT dosing, and give Cardizem 10 mg bolus and start low dose 5 mg/hr drip. Recommend 2-d echo. Will check troponins. Discussed situation at length with her daughter who has a PhD in [employer] studies. Recommend DC amlodipine and change to oral cardizem after intravenous infusion is discontinued. Pt is also undergoing w/u for 50 lb weight loss with CT scan ordered by Dr. Forde Dandy.    Troy Sine, MD, [facility] 09/19/2013 5:19 PM

## 2013-09-19 NOTE — H&P (Signed)
MAY 20,2015 PCP:   Latasha Stack, MD   Chief Complaint:  Weakness, dyspnea weight loss  HPI:  History of Present Illness: hasnt felt well for 6 weeks no energy at all has to push herself gets SOB and out of energy difficulty swallowing , a lot of mucous bowels are OK, QOD  back pain at times no sense of fast HR   Review of Systems:  Review of Systems -      Complains of anorexia, fatigue, malaise, weight loss.        Denies fevers, chills, headache, sweats, sleep disturbance.   Eyes:       Complains of blurring, vision loss, photophobia.   Ears/Nose/Throat:       Complains of dysphagia.        Denies sore throat, hoarseness.   Cardiovascular:       Complains of dyspnea on exertion, orthopnea.        Denies chest pains, palpitations, peripheral edema.   Respiratory:       Complains of dyspnea.        Denies cough, wheezing.   Gastrointestinal:       Denies nausea, vomiting, diarrhea, constipation, heartburn.   Genitourinary:       Denies hematuria, nocturia, urinary frequency.   Musculoskeletal:       Complains of back pain.        Denies stiffness, arthritis.   Skin:       Denies rash, itching, dryness.   Neurologic:       Complains of weakness, tremors, dizziness, gait instability.        Denies seizures, syncope, tremors.   Psychiatric:       Denies depression, anxiety, memory loss.   Endocrine:       Denies cold intolerance, heat intolerance.   Heme/Lymphatic:       Denies abnormal bruising, bleeding.   Allergic/Immunologic:       Denies urticaria, hay fever.   Past Medical History: Past Medical History  Diagnosis Date  . Arthritis   . Diabetes mellitus   . Hyperlipidemia   . Hypertension   . Dysrhythmia     hx of atrial fib 2/13   . Macular degeneration   . Chronic kidney disease     stress incontinence , wears depends  Past Medical History (reviewed - no changes required): DIABETIC  RETINOPATHY THYROID CYST    MACULAR  DEGENERATION HYPERTENSION DIABETES MELLITUS, TYPE II, about age 89 carotid dz with 1-39% on right in 2005 JAN 2012 admission  DISCHARGE DIAGNOSES:   1. Fall with rhabdomyolysis, now clinically improved with no evidence  of syncope.   2. Atrial fibrillation with an rapidly to response, now converted to  sinus rhythm.   3. Hypertension with fair control.   4. Urinary tract infection with Escherichia coli, now treated   5. Macular degeneration.   6. Type 2 diabetes with fair control, now transitioned back to oral agents.  Surgical History (reviewed - no changes required): TAH (1990) Endometrial Polypectomy Laser Surgery for Macular Degen Ankle Fracture Repair Cataract Extraction, Bilateral melanoma on shoulder, removed 2013  Past Surgical History  Procedure Laterality Date  . Ankle surgery    . Back    . Abdominal hysterectomy    . Melanoma excision  02/01/2012    Procedure: MELANOMA EXCISION;  Surgeon: Rolm Bookbinder, MD;  Location: [facility];  Service: General;  Laterality: Left;  wide excision left arm melanoma    Medications: Prior to Admission medications  Medication Sig Start Date End Date Taking? Authorizing Provider  amLODipine (NORVASC) 5 MG tablet Take 5 mg by mouth daily with breakfast.  11/30/11   Historical Provider, MD  aspirin 81 MG tablet Take 81 mg by mouth daily with breakfast.     Historical Provider, MD  benazepril (LOTENSIN) 20 MG tablet Take 20 mg by mouth daily with breakfast.  11/30/11   Historical Provider, MD  DIOVAN 320 MG tablet Take 320 mg by mouth daily with breakfast.  01/18/12   Historical Provider, MD  GLIPIZIDE XL 5 MG 24 hr tablet Take 5 mg by mouth daily with breakfast.  01/18/12   Historical Provider, MD  metFORMIN (GLUCOPHAGE) 1000 MG tablet Take 500 mg by mouth daily with breakfast.  12/07/11   Historical Provider, MD  metoprolol tartrate (LOPRESSOR) 25 MG tablet Take 1 tablet (25 mg total) by mouth 2 (two) times daily. 02/02/12   Rolm Bookbinder,  MD  Multiple Vitamins-Minerals (MULTIVITAMIN WITH MINERALS) tablet Take 1 tablet by mouth daily.    Historical Provider, MD  naproxen sodium (ANAPROX) 220 MG tablet Take 220 mg by mouth 2 (two) times daily with a meal.    Historical Provider, MD  ONGLYZA 5 MG TABS tablet Take 5 mg by mouth daily with breakfast.  12/28/11   Historical Provider, MD  valsartan-hydrochlorothiazide (DIOVAN-HCT) 320-12.5 MG per tablet  02/16/12   Historical Provider, MD  omplete Medication List: 1)  Ultram 50 Mg Oral Tabs (Tramadol hcl) .... One po bid 2)  Benazepril Hcl 20 Mg Tabs (Benazepril hcl) .Marland Kitchen.. 1 tab po qd 3)  Amlodipine Besylate 5 Mg Tabs (Amlodipine besylate) .Marland Kitchen.. 1 tab po qd 4)  Metoprolol Tartrate 25 Mg Tabs (Metoprolol tartrate) .... Take one tablet by mouth twice daily 5)  Diovan Hct 320-12.5 Mg Tabs (Valsartan-hydrochlorothiazide) .Marland Kitchen.. 1 tab po qd 6)  Glucotrol Xl 5 Mg Xr24h-tab (Glipizide) .... Take one tablet by mouth twice daily 7)  Onglyza 5 Mg Tabs (Saxagliptin hcl) .Marland Kitchen.. 1 tab po qd 8)  Metformin Hcl 1000 Mg Tabs (Metformin hcl) .... 1/2 tab bid 9)  Aspirin 81 Mg Ec Tab (Aspirin) .... Take one (1) tablet by mouth daily 10)  Centrum Silver Ultra Womens Tabs (Multiple vitamins-minerals) .... Once daily 11)  Aleve 220 Mg Tabs (Naproxen sodium) .... Bid prn   Allergies:  No Known Allergies  Social History: Widowed, lives alone Latasha Proctor is a widowed mother of 1 and grandmother of 2. She is retired and lives alone. Patient denies personal history of both alcohol and tobacco use   Family History: Family History  Problem Relation Age of Onset  . Diabetes Mother   . Stroke Father   Dad died at 81, CAD Mom died at 2, old age 26 brothers deceased  Physical Exam:  Pulse 120 IR IR Entered weight:  113  lbs., Calculated Weight: 113 lbs., ( 51.26 kg) Height: 63 in., ( 160.02 cm) Pulse rate: 64 Pulse rhythm: regular Respirations: 14  Blood Pressure #1: 110 / 78 mm Hg   Diabetes Hgb  A1C: 5.6%,  CBG: 144 NF   BMI: 20.02 BSA: 1.52 Wt Chg: -25 lbs since 06/12/2013 Physical Exam  General appearance: less well, thin, dyspneic  Eyes  External: no lid lag or exopthalmos, anicteric, no nystagmus  Ears, Nose and Throat  External ears: normal, no lesions or deformities External nose: normal, no lesions or deformities  Neck  Neck: supple, no masses, trachea midline Thyroid: no nodules, masses, tenderness, or enlargement  Respiratory  Respiratory effort: no intercostal retractions or use of accessory muscles Auscultation: reduced right base, no wheeze  Cardiovascular  Auscultation: rapid and IR IR with murmur Carotid arteries: no bruits heard Pedal pulses: pulses 2+, symmetric Periph. circulation: 1+ bilat edema  Gastrointestinal  Abdomen: protuberant, soft NT  Lymphatic  Neck: no cervical adenopathy  Musculoskeletal  Gait and station: unsteady Digits and nails: fungal  Skin  Inspection: thin with some bruising Speech: Normal Motor Strength: weak  Mental Status Exam  Judgment, insight: intact Orientation: oriented to time, place, and person Memory: intact Mood and affect: Normal Mood Mentation:  Alert & Oriented       Labs on Admission:  pending  Feb 10,2015 labs ests: (1) Cortisol (774128) ! Cortisol                  13.9 ug/dL                  2.3-19.4                                            Cortisol AM         6.2 - 19.4                                            Cortisol PM         2.3 - 11.9  Tests: (2) BASIC METABOLIC PANEL (7867)   GLUCOSE              [H]  174 mg/dl                   60-110   BUN                       21 mg/dl                    5-23   CREATININE                0.9 mg/dl                   0.3-1.5  eGFR Non-African American                             58.4  eGFR African American                             70.7   SODIUM                    140 mEq/L                   135-148   POTASSIUM                 4.1 mEq/L                    3.5-5.3   CHLORIDE                  102 mEq/L  80-111   CO2                       33 mEq/L                    15-35   CALCIUM                   9.5 mg/dL                   7.0-10.5  Tests: (3) CBC (2000)   WBC                       5.30 K/uL                   4.10-10.90   LYM                       1.3 K/uL                    0.6-4.1 ! MID                       0.5 K/uL                    0.0-1.8   GRAN                      3.6 K/uL                    2.0-7.8   LYM%                      23.9 %                      10.0-58.5 ! MID%                      8.6 %                       0.1-24.0   GRAN%                     67.5                        37.0-92.0   RBC                  [L]  3.9 M/uL                    4.2-6.3   HGB                  [L]  10.7 g/dL                   12.0-18.0   HCT                  [L]  32.2 %                      37.0-51.0   MCV                       83.3 fL  80.0-97.0   MCH                       27.6 pg                     26.0-32.0   MCHC                      33.2 g/dL                   31.0-36.0   PLT                       222 K/uL                    140-440  Tests: (4) LIPID (2105)   CHOLESTEROL               143 mg/dl                   100-200   TRIGLYCERIDES             63 mg/dl                    30-149   HDL                       43 mg/dl                    40-60   LDL                       87 mg/dl   CHOL/HDL RATIO            3.3 RATIO        LDL/HDL RATIO             2.0                         1.7-2.5   NON-HDL                   100.0  Tests: (5) LIVER (3000)   TOTAL PROTEIN             6.3 g/dL                    6.0-8.5   ALBUMIN                   3.8 g/dL                    2.0-5.5   AST                       15 IU/L                     7-45   ALT                       8 IU/L                      5-40   ALK PHOS                  67 IU/L  37-137   TOTAL BILIRUBIN            0.5 mg/dl                   0.1-1.5   DIRECT BILIRUBIN          0.2 mg/dL                   0.0-0.3  Tests: (6) TSH (3110)   TSH                       3.11 uIU/ml                 0.40-4.20   Radiological Exams on Admission:  CXR at [facility]:  Mild CM, no CHF, hardware in thoracic psine EKG: Afib with RVR at 140, one PVC, lateral Twave flattening, IVCD   Assessment/Plan Active Problems: Problem # 1:  ATRIAL FIBRILLATION WITH RAPID VENTRICULAR RESPONSE (ICD-427.31) (ICD10-I48.91) running at 140 despite beta blockade suspect this may be thyroid related  will admit to SDU, rx cardizem, check ECHO  DNR status  Her updated medication list for this problem includes:    Metoprolol Tartrate 25 Mg Tabs (Metoprolol tartrate) .Marland Kitchen... Take one tablet by mouth twice daily    Aspirin 81 Mg Ec Tab (Aspirin) .Marland Kitchen... Take one (1) tablet by mouth daily   Problem # 2:  Weight loss (ICD-783.21) (ICD10-R63.4) will need to see thyroid status, lytes, CBC CXR shows no mass and no CHF and no TB  Problem # 3:  DIABETES MELLITUS, WITH OPHTHALMIC COMPLICATIONS (QPR-916.38) (ICD10-E11.39) BS's fine, need to see renal function Rx SSI for now Her updated medication list for this problem includes:    Benazepril Hcl 20 Mg Tabs (Benazepril hcl) .Marland Kitchen... 1 tab po qd    Diovan Hct 320-12.5 Mg Tabs (Valsartan-hydrochlorothiazide) .Marland Kitchen... 1 tab po qd    Glucotrol Xl 5 Mg Xr24h-tab (Glipizide) .Marland Kitchen... Take one tablet by mouth twice daily    Onglyza 5 Mg Tabs (Saxagliptin hcl) .Marland Kitchen... 1 tab po qd    Metformin Hcl 1000 Mg Tabs (Metformin hcl) .Marland Kitchen... 1/2 tab bid    Aspirin 81 Mg Ec Tab (Aspirin) .Marland Kitchen... Take one (1) tablet by mouth daily  Orders: HGB A1C (CPT-83036) Capillary Glucose (GYK-59935)   Problem # 4:  MACULAR DEGENERATION (ICD-362.57) (ICD10-H35.30) liimited vision  Problem # 5:  HYPERTENSION (ICD-401.9) (ICD10-I10) B/P: 110/78  BP OK check lytes Her updated medication list for this problem includes:    Benazepril  Hcl 20 Mg Tabs (Benazepril hcl) .Marland Kitchen... 1 tab po qd    Amlodipine Besylate 5 Mg Tabs (Amlodipine besylate) .Marland Kitchen... 1 tab po qd    Metoprolol Tartrate 25 Mg Tabs (Metoprolol tartrate) .Marland Kitchen... Take one tablet by mouth twice daily    Diovan Hct 320-12.5 Mg Tabs (Valsartan-hydrochlorothiazide) .Marland Kitchen... 1 tab po qd   Problem # 6:  MALIGNANT MELANOMA (ICD-172.9) (ICD10-C43.9) NED  [place] 09/19/2013, 12:55 PM

## 2013-09-20 ENCOUNTER — Encounter (HOSPITAL_COMMUNITY): Payer: Self-pay | Admitting: *Deleted

## 2013-09-20 DIAGNOSIS — I509 Heart failure, unspecified: Secondary | ICD-10-CM

## 2013-09-20 DIAGNOSIS — I359 Nonrheumatic aortic valve disorder, unspecified: Secondary | ICD-10-CM

## 2013-09-20 LAB — CBC
HCT: 30.8 % — ABNORMAL LOW (ref 36.0–46.0)
HEMOGLOBIN: 9.9 g/dL — AB (ref 12.0–15.0)
MCH: 27 pg (ref 26.0–34.0)
MCHC: 32.1 g/dL (ref 30.0–36.0)
MCV: 83.9 fL (ref 78.0–100.0)
Platelets: 123 10*3/uL — ABNORMAL LOW (ref 150–400)
RBC: 3.67 MIL/uL — ABNORMAL LOW (ref 3.87–5.11)
RDW: 15.9 % — AB (ref 11.5–15.5)
WBC: 3.3 10*3/uL — ABNORMAL LOW (ref 4.0–10.5)

## 2013-09-20 LAB — GLUCOSE, CAPILLARY
GLUCOSE-CAPILLARY: 81 mg/dL (ref 70–99)
GLUCOSE-CAPILLARY: 112 mg/dL — AB (ref 70–99)
GLUCOSE-CAPILLARY: 125 mg/dL — AB (ref 70–99)
Glucose-Capillary: 103 mg/dL — ABNORMAL HIGH (ref 70–99)

## 2013-09-20 LAB — HEPARIN LEVEL (UNFRACTIONATED): HEPARIN UNFRACTIONATED: 0.25 [IU]/mL — AB (ref 0.30–0.70)

## 2013-09-20 LAB — TROPONIN I: Troponin I: <0.3 ng/mL (ref <0.30)

## 2013-09-20 LAB — CORTISOL: Cortisol, Plasma: 22.6 ug/dL

## 2013-09-20 LAB — PRO B NATRIURETIC PEPTIDE: PRO B NATRI PEPTIDE: 3480 pg/mL — AB (ref 0–450)

## 2013-09-20 MED ORDER — ENSURE COMPLETE PO LIQD
237.0000 mL | Freq: Every day | ORAL | Status: DC
  Administered 2013-09-20 – 2013-09-24 (×4): 237 mL via ORAL

## 2013-09-20 MED ORDER — FUROSEMIDE 20 MG PO TABS
20.0000 mg | ORAL_TABLET | Freq: Every day | ORAL | Status: DC
  Administered 2013-09-20 – 2013-09-25 (×6): 20 mg via ORAL
  Filled 2013-09-20 (×6): qty 1

## 2013-09-20 NOTE — Progress Notes (Addendum)
Subjective: Had a reasonable night. No CP or SOB> feels OK this AM   Objective: Vital signs in last 24 hours: Temp:  [97.7 F (36.5 C)-98.9 F (37.2 C)] 98.3 F (36.8 C) (05/21 0453) Pulse Rate:  [83-127] 90 (05/21 0453) Resp:  [16-18] 16 (05/21 0453) BP: (97-137)/(49-82) 137/82 mmHg (05/21 0453) SpO2:  [93 %-97 %] 95 % (05/21 0453) Weight:  [51.982 kg (114 lb 9.6 oz)] 51.982 kg (114 lb 9.6 oz) (05/20 1332)  Intake/Output from previous day: 05/20 0701 - 05/21 0700 In: -  Out: 250 [Urine:250] Intake/Output this shift:    Sitting up in no distress. Face symmetric. Lungs fairly clear. Ht IR IR but slower. abd protuberant soft NT, no edema. Awake alert, mentating well  Lab Results   Recent Labs  09/19/13 1515 09/20/13 0320  WBC 7.1 3.3*  RBC 4.48 3.67*  HGB 11.7* 9.9*  HCT 37.9 30.8*  MCV 84.6 83.9  MCH 26.1 27.0  RDW 15.6* 15.9*  PLT 158 123*    Recent Labs  09/19/13 1515  NA 140  K 4.3  CL 96  CO2 30  GLUCOSE 100*  BUN 27*  CREATININE 1.06  CALCIUM 9.3    Studies/Results: Ct Abdomen Pelvis W Contrast  09/19/2013   CLINICAL DATA:  Weight loss, history diabetes, hypertension, chronic kidney disease  EXAM: CT ABDOMEN AND PELVIS WITH CONTRAST  TECHNIQUE: Multidetector CT imaging of the abdomen and pelvis was performed using the standard protocol following bolus administration of intravenous contrast. Sagittal and coronal MPR images reconstructed from axial data set.  CONTRAST:  16mL OMNIPAQUE IOHEXOL 300 MG/ML SOLN IV. Dilute oral contrast was not administered  COMPARISON:  None  FINDINGS: BILATERAL pleural effusions LEFT greater on the RIGHT with minimal bibasilar atelectasis.  Beam hardening artifacts from orthopedic hardware at inferior thoracic spine.  Numerous hepatic cysts largest lateral segment LEFT lobe 4.1 x 3.0 cm image 18.  Remainder of liver, spleen, pancreas, and adrenal glands normal appearance.  Renal cortical thinning bilaterally with patchy areas of  cortical and medullary enhancement on delayed images, nonspecific but could represent infection.  Scattered atherosclerotic calcifications.  Stomach decompressed.  Large and small bowel loops normal appearance.  Normal appearing bladder and ureters.  Uterus surgically absent with normal sized ovaries noted.  No mass, adenopathy, free fluid or inflammatory process.  Scattered pelvic phleboliths.  Bones diffusely demineralized with degenerative changes at both hip joints.  Multilevel degenerative disc and facet disease changes thoracolumbar spine.  IMPRESSION: Hepatic cystic disease.  Patchy poorly defined areas of renal enhancement on delayed images, nonspecific, could potentially represent infection ; recommend correlation with urinalysis.  BILATERAL pleural effusions and minimal bibasilar atelectasis, greater on LEFT.   Electronically Signed   By: Lavonia Dana M.D.   On: 09/19/2013 18:10    Scheduled Meds: . insulin aspart  0-5 Units Subcutaneous QHS  . insulin aspart  0-9 Units Subcutaneous TID WC  . insulin aspart  3 Units Subcutaneous TID WC   Continuous Infusions: . diltiazem (CARDIZEM) infusion 5 mg/hr (09/20/13 0644)  . heparin 850 Units/hr (09/20/13 0518)   PRN Meds:  Assessment/Plan: AFIB WITH RVR: has slowed to under 100 but still in AFib. ECHO pending, cards on board On IV heparin BILAT PLEURAL EFFUSIONS: likely due to nutrition and cardiac issues, may need diagnostic thoracentesis WEIGHT LOSS: CT abd shows no masses, kidneys not well imaged. May need [place]. Doubt hepatic cysts are importatnt DM 2: BS's fine HYPERTENSION: doing OK ANEMIA: SPEP ordered.  DNR   LOS: 1 day   [place] 09/20/2013, 7:24 AM

## 2013-09-20 NOTE — Progress Notes (Signed)
Subjective:  Pt states she is weak and had an episode of 'an elephant sitting on my chest' last night  Objective:  Temp:  [97.7 F (36.5 C)-98.9 F (37.2 C)] 98.3 F (36.8 C) (05/21 0453) Pulse Rate:  [83-127] 90 (05/21 0453) Resp:  [16-18] 16 (05/21 0453) BP: (97-137)/(49-82) 137/82 mmHg (05/21 0453) SpO2:  [93 %-97 %] 95 % (05/21 0453) Weight:  [114 lb 9.6 oz (51.982 kg)] 114 lb 9.6 oz (51.982 kg) (05/20 1332) Weight change:   Intake/Output from previous day: 05/20 0701 - 05/21 0700 In: -  Out: 250 [Urine:250]  Intake/Output from this shift:    Physical Exam: General appearance: alert and no distress Neck: no adenopathy, no JVD, supple, symmetrical, trachea midline, thyroid not enlarged, symmetric, no tenderness/mass/nodules and Soft bilateral carotid bruits Lungs: clear to auscultation bilaterally Heart: irregularly irregular rhythm Extremities: trace edema bilaterally  Lab Results: Results for orders placed during the hospital encounter of 09/19/13 (from the past 48 hour(s))  COMPREHENSIVE METABOLIC PANEL     Status: Abnormal   Collection Time    09/19/13  3:15 PM      Result Value Ref Range   Sodium 140  137 - 147 mEq/L   Potassium 4.3  3.7 - 5.3 mEq/L   Chloride 96  96 - 112 mEq/L   CO2 30  19 - 32 mEq/L   Glucose, Bld 100 (*) 70 - 99 mg/dL   BUN 27 (*) 6 - 23 mg/dL   Creatinine, Ser 1.06  0.50 - 1.10 mg/dL   Calcium 9.3  8.4 - 10.5 mg/dL   Total Protein 6.5  6.0 - 8.3 g/dL   Albumin 3.6  3.5 - 5.2 g/dL   AST 17  0 - 37 U/L   ALT 10  0 - 35 U/L   Alkaline Phosphatase 61  39 - 117 U/L   Total Bilirubin 0.8  0.3 - 1.2 mg/dL   GFR calc non Af Amer 44 (*) >90 mL/min   GFR calc Af Amer 51 (*) >90 mL/min   Comment: (NOTE)     The eGFR has been calculated using the CKD EPI equation.     This calculation has not been validated in all clinical situations.     eGFR's persistently <90 mL/min signify possible Chronic Kidney     Disease.  CBC     Status:  Abnormal   Collection Time    09/19/13  3:15 PM      Result Value Ref Range   WBC 7.1  4.0 - 10.5 K/uL   RBC 4.48  3.87 - 5.11 MIL/uL   Hemoglobin 11.7 (*) 12.0 - 15.0 g/dL   HCT 37.9  36.0 - 46.0 %   MCV 84.6  78.0 - 100.0 fL   MCH 26.1  26.0 - 34.0 pg   MCHC 30.9  30.0 - 36.0 g/dL   RDW 15.6 (*) 11.5 - 15.5 %   Platelets 158  150 - 400 K/uL  TSH     Status: None   Collection Time    09/19/13  3:15 PM      Result Value Ref Range   TSH 2.130  0.350 - 4.500 uIU/mL   Comment: Please note change in reference range.  T4, FREE     Status: None   Collection Time    09/19/13  3:15 PM      Result Value Ref Range   Free T4 1.08  0.80 - 1.80 ng/dL   Comment:  Performed at [facility]     Status: None   Collection Time    09/19/13  3:15 PM      Result Value Ref Range   Cortisol, Plasma 16.0     Comment: (NOTE)     AM:  4.3 - 22.4 ug/dL     PM:  3.1 - 16.7 ug/dL     Performed at [facility]     Status: Abnormal   Collection Time    09/19/13  6:20 PM      Result Value Ref Range   Pro B Natriuretic peptide (BNP) 4537.0 (*) 0 - 450 pg/mL  TROPONIN I     Status: None   Collection Time    09/19/13  6:20 PM      Result Value Ref Range   Troponin I <0.30  <0.30 ng/mL   Comment:            Due to the release kinetics of cTnI,     a negative result within the first hours     of the onset of symptoms does not rule out     myocardial infarction with certainty.     If myocardial infarction is still suspected,     repeat the test at appropriate intervals.  GLUCOSE, CAPILLARY     Status: Abnormal   Collection Time    09/19/13  9:01 PM      Result Value Ref Range   Glucose-Capillary 108 (*) 70 - 99 mg/dL  TROPONIN I     Status: None   Collection Time    09/19/13 10:55 PM      Result Value Ref Range   Troponin I <0.30  <0.30 ng/mL   Comment:            Due to the release kinetics of cTnI,     a negative result within the first  hours     of the onset of symptoms does not rule out     myocardial infarction with certainty.     If myocardial infarction is still suspected,     repeat the test at appropriate intervals.  PRO B NATRIURETIC PEPTIDE     Status: Abnormal   Collection Time    09/20/13  3:20 AM      Result Value Ref Range   Pro B Natriuretic peptide (BNP) 3480.0 (*) 0 - 450 pg/mL  HEPARIN LEVEL (UNFRACTIONATED)     Status: Abnormal   Collection Time    09/20/13  3:20 AM      Result Value Ref Range   Heparin Unfractionated 0.25 (*) 0.30 - 0.70 IU/mL   Comment:            IF HEPARIN RESULTS ARE BELOW     EXPECTED VALUES, AND PATIENT     DOSAGE HAS BEEN CONFIRMED,     SUGGEST FOLLOW UP TESTING     OF ANTITHROMBIN III LEVELS.  CBC     Status: Abnormal   Collection Time    09/20/13  3:20 AM      Result Value Ref Range   WBC 3.3 (*) 4.0 - 10.5 K/uL   RBC 3.67 (*) 3.87 - 5.11 MIL/uL   Hemoglobin 9.9 (*) 12.0 - 15.0 g/dL   HCT 30.8 (*) 36.0 - 46.0 %   MCV 83.9  78.0 - 100.0 fL   MCH 27.0  26.0 - 34.0 pg   MCHC 32.1  30.0 - 36.0  g/dL   RDW 15.9 (*) 11.5 - 15.5 %   Platelets 123 (*) 150 - 400 K/uL  GLUCOSE, CAPILLARY     Status: None   Collection Time    09/20/13  6:17 AM      Result Value Ref Range   Glucose-Capillary 81  70 - 99 mg/dL    Imaging: Imaging results have been reviewed  Assessment/Plan:   1. Active Problems: 2.   Atrial fibrillation with RVR 3.   Time Spent Directly with Patient:  20 minutes  Length of Stay:  LOS: 1 day   Pt admitted with AFIB with RVR and c/o weakness and SOB for 6 weeks. Currently on IV hep and dilt. Rate better controlled. Enz neg. BNP elevated at 3400 c/w CHF. 2D TTE pending. I have spoke with Dr. Forde Dandy who relates that pt lives independently and is biologically younger then her age. She has also lost 50# over the past year unexplained. CT scan abd/chest unrevealing. She will ultimately require a NOAC. Given CP will get Lexiscan myoview  To R/O an  ischemic etiology. Will ultimately require a TEE DCCV to restore NSR.  Lorretta Harp 09/20/2013, 9:35 AM

## 2013-09-20 NOTE — Progress Notes (Addendum)
INITIAL NUTRITION ASSESSMENT  DOCUMENTATION CODES Per approved criteria  -Severe malnutrition in the context of chronic illness   INTERVENTION: Ensure Complete po daily, each supplement provides 350 kcal and 13 grams of protein RD to follow for nutrition care plan  NUTRITION DIAGNOSIS: Unintended weight loss related to unknown etiology as evidenced by 30% weight loss x 6 months  Goal: Pt to meet >/= 90% of their estimated nutrition needs   Monitor:  PO & supplemental intake, weight, labs, I/O's  Reason for Assessment: Malnutrition Screening Tool Report  80 y.o. female  Admitting Dx: weakness, weight loss  ASSESSMENT: 80 y.o. female with PMH significant for diabetes type 2, HTN, macular degeneration and atrial fibrillation; presented with weakness, dyspnea and weight loss.  Patient eating lunch upon RD visit; family member visiting; reports she's had a poor appetite for the past few weeks PTA; would try and eat 3 times per day, however, her portions were small; pt also endorses a 50 lb weight loss in the past 6 months (30%) -- severe for time frame; amenable to Ensure Complete supplement daily; RD to order.  Dietary recall: Breakfast: toast or cereal Lunch: cheese & crackers, soup Dinner: cheese & crackers, soup  Nutrition Focused Physical Exam:  Subcutaneous Fat:  Orbital Region: N/A Upper Arm Region: mild to moderate depletion Thoracic and Lumbar Region: N/A  Muscle:  Temple Region: mild to moderate depletion Clavicle Bone Region: mild to moderate depletion Clavicle and Acromion Bone Region: mild to moderate depletion Scapular Bone Region: N/A Dorsal Hand: N/A Patellar Region: N/A Anterior Thigh Region: N/A Posterior Calf Region: N/A  Edema: bilateral extremities   Patient meets criteria for severe malnutrition in the context of  as evidenced by < 75% intake of estimated energy requirement for > 1 month and 30% weight loss x 6 months.  Height: Ht Readings  from Last 1 Encounters:  09/19/13 5\' 3"  (1.6 m)    Weight: Wt Readings from Last 1 Encounters:  09/19/13 114 lb 9.6 oz (51.982 kg)    Ideal Body Weight: 115 lb  % Ideal Body Weight: 99%  Wt Readings from Last 10 Encounters:  09/19/13 114 lb 9.6 oz (51.982 kg)  02/21/12 170 lb (77.111 kg)  02/01/12 170 lb (77.11 kg)  02/01/12 170 lb (77.11 kg)  01/31/12 170 lb (77.111 kg)  01/24/12 171 lb 12.8 oz (77.928 kg)    Usual Body Weight: ~ 160 lb  % Usual Body Weight: 71%  BMI:  Body mass index is 20.31 kg/(m^2).  Estimated Nutritional Needs: Kcal: 1400-1600 Protein: 65-75 gm  Fluid: >/= 1.5 L  Skin: Intact  Diet Order: Carb Control  EDUCATION NEEDS: -No education needs identified at this time   Intake/Output Summary (Last 24 hours) at 09/20/13 1330 Last data filed at 09/20/13 0100  Gross per 24 hour  Intake      0 ml  Output    250 ml  Net   -250 ml    Labs:   Recent Labs Lab 09/19/13 1515  NA 140  K 4.3  CL 96  CO2 30  BUN 27*  CREATININE 1.06  CALCIUM 9.3  GLUCOSE 100*    CBG (last 3)   Recent Labs  09/19/13 2101 09/20/13 0617 09/20/13 1104  GLUCAP 108* 81 112*    Scheduled Meds: . furosemide  20 mg Oral Daily  . insulin aspart  0-5 Units Subcutaneous QHS  . insulin aspart  0-9 Units Subcutaneous TID WC  . insulin aspart  3 Units  Subcutaneous TID WC    Continuous Infusions: . diltiazem (CARDIZEM) infusion 5 mg/hr (09/20/13 0644)  . heparin 850 Units/hr (09/20/13 0518)    Past Medical History  Diagnosis Date  . Arthritis   . Diabetes mellitus   . Hyperlipidemia   . Hypertension   . Dysrhythmia     hx of atrial fib 2/13   . Macular degeneration   . Chronic kidney disease     stress incontinence , wears depends    Past Surgical History  Procedure Laterality Date  . Ankle surgery    . Back    . Abdominal hysterectomy    . Melanoma excision  02/01/2012    Procedure: MELANOMA EXCISION;  Surgeon: Rolm Bookbinder, MD;   Location: [facility];  Service: General;  Laterality: Left;  wide excision left arm melanoma    Arthur Holms, RD, LDN Pager #: [phone] After-Hours Pager #: [phone]

## 2013-09-20 NOTE — Progress Notes (Signed)
Utilization review completed. Antar Milks, RN, BSN. 

## 2013-09-20 NOTE — Progress Notes (Signed)
ANTICOAGULATION CONSULT NOTE  Pharmacy Consult for heparin Indication: atrial fibrillation  No Known Allergies  Patient Measurements: Height: 5\' 3"  (160 cm) Weight: 114 lb 9.6 oz (51.982 kg) IBW/kg (Calculated) : 52.4   Vital Signs: Temp: 98.3 F (36.8 C) (05/21 0453) Temp src: Oral (05/21 0453) BP: 137/82 mmHg (05/21 0453) Pulse Rate: 90 (05/21 0453)  Labs:  Recent Labs  09/19/13 1515 09/19/13 1820 09/19/13 2255 09/20/13 0320  HGB 11.7*  --   --  9.9*  HCT 37.9  --   --  30.8*  PLT 158  --   --  123*  HEPARINUNFRC  --   --   --  0.25*  CREATININE 1.06  --   --   --   TROPONINI  --  <0.30 <0.30  --     Estimated Creatinine Clearance: 27.2 ml/min (by C-G formula based on Cr of 1.06).  Assessment: 80 yo female with Afib for heparin  Goal of Therapy:  Heparin level 0.3-0.7 units/ml Monitor platelets by anticoagulation protocol: Yes   Plan:  Increase Heparin 850 units/hr Check heparin level in 8 hours.   Phillis Knack, PharmD, BCPS  09/20/2013 5:06 AM

## 2013-09-20 NOTE — Progress Notes (Signed)
Echocardiogram 2D Echocardiogram has been performed.  Ines Bloomer 09/20/2013, 2:16 PM

## 2013-09-20 NOTE — Care Management Note (Unsigned)
    Page 1 of 1   09/21/2013     4:09:53 PM CARE MANAGEMENT NOTE 09/21/2013  Patient:  Latasha Proctor, Latasha Proctor   Account Number:  [id number]  Date Initiated:  09/20/2013  Documentation initiated by:  Kimbly Eanes  Subjective/Objective Assessment:   Pt adm on 09/19/13 with Afib with RVR.  PTA, pt independent, lives alone.     Action/Plan:   Will follow for dc needs as pt progresses.   Anticipated DC Date:  09/24/2013   Anticipated DC Plan:  SKILLED NURSING FACILITY  In-house referral  Clinical Social Worker      DC Planning Services  CM consult      Choice offered to / List presented to:             Status of service:  In process, will continue to follow Medicare Important Message given?   (If response is "NO", the following Medicare IM given date fields will be blank) Date Medicare IM given:   Date Additional Medicare IM given:    Discharge Disposition:    Per UR Regulation:  Reviewed for med. necessity/level of care/duration of stay  If discussed at [place] of Stay Meetings, dates discussed:    Comments:  09/21/13 Ellan Lambert, RN, BSN [phone] Met with pt's daughter to discuss dc plans while pt in stress test.  Daughter states pt has been living alone, and doing well until the last month or so; seems to be weak and fatigued.  Normally, pt prepares own meals, goes to grocery store with daughter, and does laundry.  OT has seen today and recommended SNF for rehab.  PT eval pending.   Daughter thought pt may need short term SNF at dc for rehab, as she needs to be stronger prior to returning home.  Pt has been at SNF in the past, and dtr states pt  would agree to this. Consulted CSW to follow up over the weekend with pt and daughter on possible SNF placement.  Will follow progress.

## 2013-09-21 ENCOUNTER — Inpatient Hospital Stay (HOSPITAL_COMMUNITY): Payer: Medicare Other

## 2013-09-21 DIAGNOSIS — J90 Pleural effusion, not elsewhere classified: Secondary | ICD-10-CM | POA: Diagnosis present

## 2013-09-21 DIAGNOSIS — R634 Abnormal weight loss: Secondary | ICD-10-CM | POA: Diagnosis present

## 2013-09-21 DIAGNOSIS — R079 Chest pain, unspecified: Secondary | ICD-10-CM

## 2013-09-21 LAB — PRO B NATRIURETIC PEPTIDE: Pro B Natriuretic peptide (BNP): 3091 pg/mL — ABNORMAL HIGH (ref 0–450)

## 2013-09-21 LAB — BASIC METABOLIC PANEL
BUN: 24 mg/dL — ABNORMAL HIGH (ref 6–23)
CALCIUM: 9.1 mg/dL (ref 8.4–10.5)
CO2: 29 mEq/L (ref 19–32)
Chloride: 102 mEq/L (ref 96–112)
Creatinine, Ser: 1 mg/dL (ref 0.50–1.10)
GFR calc Af Amer: 55 mL/min — ABNORMAL LOW (ref >90)
GFR, EST NON AFRICAN AMERICAN: 47 mL/min — AB (ref >90)
Glucose, Bld: 113 mg/dL — ABNORMAL HIGH (ref 70–99)
POTASSIUM: 4.7 meq/L (ref 3.7–5.3)
SODIUM: 141 meq/L (ref 137–147)

## 2013-09-21 LAB — HEPATIC FUNCTION PANEL
ALBUMIN: 3.2 g/dL — AB (ref 3.5–5.2)
ALK PHOS: 52 U/L (ref 39–117)
ALT: 7 U/L (ref 0–35)
AST: 14 U/L (ref 0–37)
Bilirubin, Direct: <0.2 mg/dL (ref 0.0–0.3)
Total Bilirubin: 0.6 mg/dL (ref 0.3–1.2)
Total Protein: 6 g/dL (ref 6.0–8.3)

## 2013-09-21 LAB — CBC
HCT: 35 % — ABNORMAL LOW (ref 36.0–46.0)
HEMOGLOBIN: 11.2 g/dL — AB (ref 12.0–15.0)
MCH: 26.8 pg (ref 26.0–34.0)
MCHC: 32 g/dL (ref 30.0–36.0)
MCV: 83.7 fL (ref 78.0–100.0)
PLATELETS: 147 10*3/uL — AB (ref 150–400)
RBC: 4.18 MIL/uL (ref 3.87–5.11)
RDW: 15.7 % — ABNORMAL HIGH (ref 11.5–15.5)
WBC: 4.9 10*3/uL (ref 4.0–10.5)

## 2013-09-21 LAB — FOLATE: Folate: 13.8 ng/mL

## 2013-09-21 LAB — GLUCOSE, CAPILLARY
Glucose-Capillary: 124 mg/dL — ABNORMAL HIGH (ref 70–99)
Glucose-Capillary: 156 mg/dL — ABNORMAL HIGH (ref 70–99)
Glucose-Capillary: 162 mg/dL — ABNORMAL HIGH (ref 70–99)

## 2013-09-21 LAB — RETICULOCYTES
RBC.: 4.25 MIL/uL (ref 3.87–5.11)
RETIC COUNT ABSOLUTE: 29.8 10*3/uL (ref 19.0–186.0)
Retic Ct Pct: 0.7 % (ref 0.4–3.1)

## 2013-09-21 LAB — HEPARIN LEVEL (UNFRACTIONATED)
Heparin Unfractionated: 0.38 IU/mL (ref 0.30–0.70)
Heparin Unfractionated: 0.67 IU/mL (ref 0.30–0.70)

## 2013-09-21 LAB — IRON AND TIBC
IRON: 31 ug/dL — AB (ref 42–135)
Saturation Ratios: 11 % — ABNORMAL LOW (ref 20–55)
TIBC: 292 ug/dL (ref 250–470)
UIBC: 261 ug/dL (ref 125–400)

## 2013-09-21 LAB — PREALBUMIN: PREALBUMIN: 10.6 mg/dL — AB (ref 17.0–34.0)

## 2013-09-21 LAB — VITAMIN B12: VITAMIN B 12: 413 pg/mL (ref 211–911)

## 2013-09-21 LAB — FERRITIN: Ferritin: 24 ng/mL (ref 10–291)

## 2013-09-21 MED ORDER — ONDANSETRON HCL 4 MG/2ML IJ SOLN
INTRAMUSCULAR | Status: AC
  Administered 2013-09-21: 4 mg via INTRAVENOUS
  Filled 2013-09-21: qty 2

## 2013-09-21 MED ORDER — TECHNETIUM TC 99M SESTAMIBI - CARDIOLITE
30.0000 | Freq: Once | INTRAVENOUS | Status: AC | PRN
  Administered 2013-09-21: 30 via INTRAVENOUS

## 2013-09-21 MED ORDER — ONDANSETRON HCL 4 MG/2ML IJ SOLN: 4.0000 mg | Freq: Four times a day (QID) | INTRAMUSCULAR | Status: DC | PRN

## 2013-09-21 MED ORDER — REGADENOSON 0.4 MG/5ML IV SOLN
INTRAVENOUS | Status: AC
  Administered 2013-09-21: 0.4 mg via INTRAVENOUS
  Filled 2013-09-21: qty 5

## 2013-09-21 MED ORDER — REGADENOSON 0.4 MG/5ML IV SOLN
0.4000 mg | Freq: Once | INTRAVENOUS | Status: AC
  Administered 2013-09-21: 0.4 mg via INTRAVENOUS
  Filled 2013-09-21: qty 5

## 2013-09-21 MED ORDER — REGADENOSON 0.4 MG/5ML IV SOLN
INTRAVENOUS | Status: AC
  Filled 2013-09-21: qty 5

## 2013-09-21 MED ORDER — ACETAMINOPHEN 325 MG PO TABS
650.0000 mg | ORAL_TABLET | ORAL | Status: DC | PRN
  Administered 2013-09-24: 650 mg via ORAL
  Filled 2013-09-21: qty 2

## 2013-09-21 MED ORDER — APIXABAN 2.5 MG PO TABS
2.5000 mg | ORAL_TABLET | Freq: Two times a day (BID) | ORAL | Status: DC
  Administered 2013-09-21 – 2013-09-25 (×8): 2.5 mg via ORAL
  Filled 2013-09-21 (×10): qty 1

## 2013-09-21 MED ORDER — DILTIAZEM HCL ER COATED BEADS 180 MG PO CP24
180.0000 mg | ORAL_CAPSULE | Freq: Every day | ORAL | Status: DC
  Administered 2013-09-21 – 2013-09-22 (×2): 180 mg via ORAL
  Filled 2013-09-21 (×3): qty 1

## 2013-09-21 MED ORDER — TECHNETIUM TC 99M SESTAMIBI GENERIC - CARDIOLITE
10.0000 | Freq: Once | INTRAVENOUS | Status: AC | PRN
  Administered 2013-09-21: 10 via INTRAVENOUS

## 2013-09-21 MED ORDER — ONDANSETRON HCL 4 MG/2ML IJ SOLN
4.0000 mg | Freq: Once | INTRAMUSCULAR | Status: AC
  Administered 2013-09-21: 4 mg via INTRAVENOUS

## 2013-09-21 NOTE — Progress Notes (Signed)
ANTICOAGULATION CONSULT NOTE - Follow Up Consult  Pharmacy Consult for Heparin Indication: atrial fibrillation  No Known Allergies  Patient Measurements: Height: 5\' 3"  (160 cm) Weight: 114 lb 9.6 oz (51.982 kg) IBW/kg (Calculated) : 52.4 Heparin Dosing Weight: 52.4 kg  Vital Signs:    Labs:  Recent Labs  09/19/13 1515 09/19/13 1820 09/19/13 2255 09/20/13 0320 09/20/13 1220 09/21/13 0103 09/21/13 0447 09/21/13 0905  HGB 11.7*  --   --  9.9*  --   --  11.2*  --   HCT 37.9  --   --  30.8*  --   --  35.0*  --   PLT 158  --   --  123*  --   --  147*  --   HEPARINUNFRC  --   --   --  0.25*  --  0.38  --  0.67  CREATININE 1.06  --   --   --   --   --  1.00  --   TROPONINI  --  <0.30 <0.30  --  <0.30  --   --   --     Estimated Creatinine Clearance: 28.9 ml/min (by C-G formula based on Cr of 1).   Assessment: 80 yo F admitted with afib with RVR. Per MD she lives independently and is biologically younger than her age. Family leaning towards no invassive procedures/testing, has declined Coumadin in the past.  Anticoagulation: Hep for afib.Heparin level 0.67. Hgb 11.2 at baseline. Difficult blood draw.  Cardiovascular: s/p stress test (results pending), likely needs TEE/DCCV prior to discharge. VSS on po Lasix, diltiazem drip  Endocrinology: Glucose 81-124 on SSI  Gastrointestinal / Nutrition: Unintentional weight loss. Check prealbumin.  Nephrology: Scr 1  Pulmonary: Has bilateral pleural effusions. Consider thoracentesis.   Hematology / Oncology: h/o anemia currently at baseline in the 11's.   PTA Medication Issues: Norvasc, ASA81, benazepril, metformin, MV, Onglyza, Diovan HCT  Goal of Therapy:  Heparin level 0.3-0.7 units/ml Monitor platelets by anticoagulation protocol: Yes   Plan:  Consider heparin 850 units/hr Daily heparin level and CBC   Tomeika Weinmann S. Alford Highland, PharmD, [facility] Clinical Staff Pharmacist Pager [phone]  [facility]  Alford Highland 09/21/2013,10:37 AM

## 2013-09-21 NOTE — Progress Notes (Signed)
At bedside to place PICC but all veins are too small for attempts.  Dr. Gwenlyn Found at bedside and updated.  Nicolette Bang Nehemiah Montee, RN VAST

## 2013-09-21 NOTE — Progress Notes (Signed)
Subjective: Patient reports that she is feeling much better.  Less short of breath.  No recurrence of chest pain that she had the prior evening- describes as a pressure under her left rib cage that awoke her from sleep.  RN reports difficulty with IV access and blood draws.  Objective: Vital signs in last 24 hours: Temp:  [98.6 F (37 C)] 98.6 F (37 C) (05/21 2158) Pulse Rate:  [85-89] 85 (05/21 2158) Resp:  [17-18] 17 (05/21 2158) BP: (115-125)/(35-80) 115/35 mmHg (05/21 2158) SpO2:  [95 %-96 %] 96 % (05/21 2158) Weight change:  Last BM Date: 09/18/13  CBG (last 3)   Recent Labs  09/20/13 1603 09/20/13 2157 09/21/13 0645  GLUCAP 125* 103* 124*    Intake/Output from previous day: 05/21 0701 - 05/22 0700 In: 240 [P.O.:240] Out: 750 [Urine:750] Intake/Output this shift:    General appearance: alert and articulate; frail, cachectic Eyes: no scleral icterus Throat: oropharynx moist without erythema Resp: decreased breath sounds left>right base with some dullness to percussion 1/4 way up Cardio: irregularly irregular rhythm GI: soft, non-tender; bowel sounds normal; no masses,  no organomegaly Extremities: no clubbing, cyanosis or edema   Lab Results:  Recent Labs  09/19/13 1515 09/21/13 0447  NA 140 141  K 4.3 4.7  CL 96 102  CO2 30 29  GLUCOSE 100* 113*  BUN 27* 24*  CREATININE 1.06 1.00  CALCIUM 9.3 9.1    Recent Labs  09/19/13 1515  AST 17  ALT 10  ALKPHOS 61  BILITOT 0.8  PROT 6.5  ALBUMIN 3.6    Recent Labs  09/20/13 0320 09/21/13 0447  WBC 3.3* 4.9  HGB 9.9* 11.2*  HCT 30.8* 35.0*  MCV 83.9 83.7  PLT 123* 147*   Lab Results  Component Value Date   INR 1.51* 05/27/2010    Recent Labs  09/19/13 1820 09/19/13 2255 09/20/13 1220  TROPONINI <0.30 <0.30 <0.30    Recent Labs  09/19/13 1515  TSH 2.130    Studies/Results: Ct Abdomen Pelvis W Contrast  09/19/2013   CLINICAL DATA:  Weight loss, history diabetes, hypertension,  chronic kidney disease  EXAM: CT ABDOMEN AND PELVIS WITH CONTRAST  TECHNIQUE: Multidetector CT imaging of the abdomen and pelvis was performed using the standard protocol following bolus administration of intravenous contrast. Sagittal and coronal MPR images reconstructed from axial data set.  CONTRAST:  78mL OMNIPAQUE IOHEXOL 300 MG/ML SOLN IV. Dilute oral contrast was not administered  COMPARISON:  None  FINDINGS: BILATERAL pleural effusions LEFT greater on the RIGHT with minimal bibasilar atelectasis.  Beam hardening artifacts from orthopedic hardware at inferior thoracic spine.  Numerous hepatic cysts largest lateral segment LEFT lobe 4.1 x 3.0 cm image 18.  Remainder of liver, spleen, pancreas, and adrenal glands normal appearance.  Renal cortical thinning bilaterally with patchy areas of cortical and medullary enhancement on delayed images, nonspecific but could represent infection.  Scattered atherosclerotic calcifications.  Stomach decompressed.  Large and small bowel loops normal appearance.  Normal appearing bladder and ureters.  Uterus surgically absent with normal sized ovaries noted.  No mass, adenopathy, free fluid or inflammatory process.  Scattered pelvic phleboliths.  Bones diffusely demineralized with degenerative changes at both hip joints.  Multilevel degenerative disc and facet disease changes thoracolumbar spine.  IMPRESSION: Hepatic cystic disease.  Patchy poorly defined areas of renal enhancement on delayed images, nonspecific, could potentially represent infection ; recommend correlation with urinalysis.  BILATERAL pleural effusions and minimal bibasilar atelectasis, greater on LEFT.  Electronically Signed   By: Lavonia Dana M.D.   On: 09/19/2013 18:10   Echocardiogram (09/20/13): Study Conclusions  - Left ventricle: The cavity size was normal. Wall thickness was normal. Systolic function was normal. The estimated ejection fraction was in the range of 55% to 60%. Wall motion was  normal; there were no regional wall motion abnormalities. - Aortic valve: There was moderate stenosis. Valve area (VTI): 1.14 cm^2. Valve area (Vmax): 1.19 cm^2. - Mitral valve: Mildly calcified, moderately thickened annulus. The findings are consistent with mild stenosis. Valve area by continuity equation (using LVOT flow): 1.4 cm^2. - Left atrium: The atrium was moderately dilated. - Right atrium: The atrium was moderately dilated. - Pulmonary arteries: PA peak pressure: 37 mm Hg (S). - Pericardium, extracardiac: A trivial pericardial effusion was identified.    Medications: Scheduled: . feeding supplement (ENSURE COMPLETE)  237 mL Oral Q1500  . furosemide  20 mg Oral Daily  . insulin aspart  0-5 Units Subcutaneous QHS  . insulin aspart  0-9 Units Subcutaneous TID WC  . insulin aspart  3 Units Subcutaneous TID WC   Continuous: . diltiazem (CARDIZEM) infusion 5 mg/hr (09/21/13 0423)  . heparin 850 Units/hr (09/21/13 0423)    Assessment/Plan: Principal Problem: 1. Atrial fibrillation with RVR- rate controlled with Diltiazem drip.  May be able to transition to oral Diltiazem.  Defer to Cardiology.  Consider transition to oral anticoagulant soon- Eliquis may be a good option given frail status and difficulty getting to office for INR with Macular Degeneration.  Active Problems: 2. Chest pain- atypical.  May be related to pleural effusion.  Scheduled for Cardiolite today per cardiology. 3. Weight loss, unintentional- TSH normal.  SPEP pending.  Obtain CXR and consider chest CT vs. Diagnostic thoracentesis.  Check prealbumen.  Ensure added. 4. Bilateral Pleural Effusions- reevaluate with CXR and consider thoracentesis vs. CT for further evaluation pending results. 5. Anemia/Thrombocytopenia- stable.  Check anemia panel.  No B symptoms other than weight loss. 6. Disposition- she thinks she would like to return home after workup is complete.  Possible discharge over the weekend if  stable- will get PT/OT, SW, CM assistance with discharge planning.    LOS: 2 days   Marton Redwood 09/21/2013, 7:01 AM

## 2013-09-21 NOTE — Progress Notes (Signed)
Lab attempted to stick for labs x2 with only heparin level successfully drawn.  Cards MD on call notified x2 with no response.  Lab to reattempt at 5am.

## 2013-09-21 NOTE — Progress Notes (Signed)
ANTICOAGULATION CONSULT NOTE - Follow Up Consult  Pharmacy Consult for heparin  Indication: atrial fibrillation  No Known Allergies  Patient Measurements: Height: 5\' 3"  (160 cm) Weight: 114 lb 9.6 oz (51.982 kg) IBW/kg (Calculated) : 52.4 Heparin Dosing Weight:   Vital Signs: Temp: 98.6 F (37 C) (05/21 2158) Temp src: Oral (05/21 2158) BP: 115/35 mmHg (05/21 2158) Pulse Rate: 85 (05/21 2158)  Labs:  Recent Labs  09/19/13 1515 09/19/13 1820 09/19/13 2255 09/20/13 0320 09/20/13 1220 09/21/13 0103  HGB 11.7*  --   --  9.9*  --   --   HCT 37.9  --   --  30.8*  --   --   PLT 158  --   --  123*  --   --   HEPARINUNFRC  --   --   --  0.25*  --  0.38  CREATININE 1.06  --   --   --   --   --   TROPONINI  --  <0.30 <0.30  --  <0.30  --     Estimated Creatinine Clearance: 27.2 ml/min (by C-G formula based on Cr of 1.06).   Medications:  Heparin at 850/hr   Assessment: Heparin is therapeutic with no bleeding complications at 9.93 IU/ml. Goal of Therapy:  Heparin level 0.3-0.7 units/ml Monitor platelets by anticoagulation protocol: Yes   Plan:  Continue at 850/hr and f/u daily labs  Latasha Proctor 09/21/2013,1:53 AM

## 2013-09-21 NOTE — Progress Notes (Signed)
ANTICOAGULATION CONSULT NOTE - Follow Up Consult  Pharmacy Consult for Heparin>>Eliquis Indication: atrial fibrillation  No Known Allergies  Patient Measurements: Height: 5\' 3"  (160 cm) Weight: 114 lb 9.6 oz (51.982 kg) IBW/kg (Calculated) : 52.4 Heparin Dosing Weight: 52.4 kg  Vital Signs: BP: 154/59 mmHg (05/22 1125) Pulse Rate: 113 (05/22 1125)  Labs:  Recent Labs  09/19/13 1515 09/19/13 1820 09/19/13 2255 09/20/13 0320 09/20/13 1220 09/21/13 0103 09/21/13 0447 09/21/13 0905  HGB 11.7*  --   --  9.9*  --   --  11.2*  --   HCT 37.9  --   --  30.8*  --   --  35.0*  --   PLT 158  --   --  123*  --   --  147*  --   HEPARINUNFRC  --   --   --  0.25*  --  0.38  --  0.67  CREATININE 1.06  --   --   --   --   --  1.00  --   TROPONINI  --  <0.30 <0.30  --  <0.30  --   --   --     Estimated Creatinine Clearance: 28.9 ml/min (by C-G formula based on Cr of 1).   Assessment: 80 yo F admitted with afib with RVR. Per MD she lives independently and is biologically younger than her age. Family leaning towards no invassive procedures/testing, has declined Coumadin in the past.  Anticoagulation: Hep for afib.Heparin level 0.67. Hgb 11.2 at baseline. Difficult blood draw. Stop IV heparin and change to Eliquis  Goal of Therapy:  Heparin level 0.3-0.7 units/ml Monitor platelets by anticoagulation protocol: Yes   Plan:  D/c IV heparin this afternoon  Start Eliquis 2.5mg  BID   Mckinzey Entwistle S. Alford Highland, PharmD, [facility] Clinical Staff Pharmacist Pager [phone]  Wayland Salinas 09/21/2013,2:35 PM

## 2013-09-21 NOTE — Progress Notes (Addendum)
Subjective:  No chest pain at rest  Objective:  Vital Signs in the last 24 hours: Temp:  [98.6 F (37 C)] 98.6 F (37 C) (05/21 2158) Pulse Rate:  [85-112] 106 (05/22 1123) Resp:  [17-18] 17 (05/21 2158) BP: (115-170)/(35-80) 170/61 mmHg (05/22 1123) SpO2:  [95 %-96 %] 96 % (05/21 2158)  Intake/Output from previous day:  Intake/Output Summary (Last 24 hours) at 09/21/13 1124 Last data filed at 09/21/13 0700  Gross per 24 hour  Intake    240 ml  Output    750 ml  Net   -510 ml    Physical Exam: General appearance: alert, cooperative, cachectic, no distress, pale and frail Lungs: decreased at bases Heart: irregularly irregular rhythm   Rate: 110  Rhythm: atrial fibrillation  Lab Results:  Recent Labs  09/20/13 0320 09/21/13 0447  WBC 3.3* 4.9  HGB 9.9* 11.2*  PLT 123* 147*    Recent Labs  09/19/13 1515 09/21/13 0447  NA 140 141  K 4.3 4.7  CL 96 102  CO2 30 29  GLUCOSE 100* 113*  BUN 27* 24*  CREATININE 1.06 1.00    Recent Labs  09/19/13 2255 09/20/13 1220  TROPONINI <0.30 <0.30   No results found for this basename: INR,  in the last 72 hours  Imaging: Ct Abdomen Pelvis W Contrast  09/19/2013   CLINICAL DATA:  Weight loss, history diabetes, hypertension, chronic kidney disease  EXAM: CT ABDOMEN AND PELVIS WITH CONTRAST  TECHNIQUE: Multidetector CT imaging of the abdomen and pelvis was performed using the standard protocol following bolus administration of intravenous contrast. Sagittal and coronal MPR images reconstructed from axial data set.  CONTRAST:  21mL OMNIPAQUE IOHEXOL 300 MG/ML SOLN IV. Dilute oral contrast was not administered  COMPARISON:  None  FINDINGS: BILATERAL pleural effusions LEFT greater on the RIGHT with minimal bibasilar atelectasis.  Beam hardening artifacts from orthopedic hardware at inferior thoracic spine.  Numerous hepatic cysts largest lateral segment LEFT lobe 4.1 x 3.0 cm image 18.  Remainder of liver, spleen,  pancreas, and adrenal glands normal appearance.  Renal cortical thinning bilaterally with patchy areas of cortical and medullary enhancement on delayed images, nonspecific but could represent infection.  Scattered atherosclerotic calcifications.  Stomach decompressed.  Large and small bowel loops normal appearance.  Normal appearing bladder and ureters.  Uterus surgically absent with normal sized ovaries noted.  No mass, adenopathy, free fluid or inflammatory process.  Scattered pelvic phleboliths.  Bones diffusely demineralized with degenerative changes at both hip joints.  Multilevel degenerative disc and facet disease changes thoracolumbar spine.  IMPRESSION: Hepatic cystic disease.  Patchy poorly defined areas of renal enhancement on delayed images, nonspecific, could potentially represent infection ; recommend correlation with urinalysis.  BILATERAL pleural effusions and minimal bibasilar atelectasis, greater on LEFT.   Electronically Signed   By: Lavonia Dana M.D.   On: 09/19/2013 18:10    Cardiac Studies:  Assessment/Plan: Pt admitted with AFIB with RVR and c/o weakness and SOB for 6 weeks. Currently on IV hep and dilt. Rate better controlled. Enz neg. BNP elevated at 3400 c/w CHF. 2D TTE pending. I have spoke with Dr. Forde Dandy who relates that pt lives independently and is biologically younger then her age. She has also lost 50# over the past year unexplained. CT scan abd/chest unrevealing. She will ultimately require a NOAC. Given CP will get Lexiscan myoview To R/O an ischemic etiology. Will ultimately require a TEE DCCV to restore NSR.   Principal  Problem:   Atrial fibrillation with RVR Active Problems:   Chest pain   Weight loss, unintentional   Bilateral pleural effusion    PLAN: Lexiscan Myoview tolerated well, images pending.  Kerin Ransom PA-C Beeper [phone] 09/21/2013, 11:24 AM  Agree with note written by Kerin Ransom PAC  Pt had Leane Call today which was nl. She remains in  AFIB with CVR. Will convert IV Dilt to PO. Change IV hep to PO NOAC. Unable to get PICC line. Poor IV access. She seems to be clinically improved with rate control, Rather then waite for nest week for TEE DCCV I think we can probably DC home tomorrow on PO meds and arrange OP DCCV in 4 weeks. Lungs clear on exam. She has nl LV fxn and mod AS on 2D echo    Lorretta Harp 09/21/2013 1:53 PM

## 2013-09-21 NOTE — Evaluation (Signed)
Occupational Therapy Evaluation Patient Details Name: Latasha Proctor MRN: [id number] DOB: [birth year] Today's Date: 09/21/2013    History of Present Illness Pt is a 80 year old female who was admitted with c/o weakness, weight loss.  She has A Fib with RVR   Clinical Impression   Pt was admitted for the above.  She normally functions at a mod I level including meal preparation and doing laundry. She is currently min A for basic adls due to deconditioning and decreased balance.  She will benefit from skilled OT to increase safety and independence with adls.  Goals in acute are for supervision level.      Follow Up Recommendations  SNF    Equipment Recommendations  None recommended by OT    Recommendations for Other Services       Precautions / Restrictions Precautions Precautions: Fall Restrictions Weight Bearing Restrictions: No      Mobility Bed Mobility Overal bed mobility: Needs Assistance Bed Mobility: Supine to Sit     Supine to sit: Min assist     General bed mobility comments: extra time, hob raised, used rail  Transfers Overall transfer level: Needs assistance Equipment used: 4-wheeled walker Transfers: Sit to/from Stand Sit to Stand: Min assist         General transfer comment: assist to steady    Balance Overall balance assessment: Needs assistance Sitting-balance support: Feet supported Sitting balance-Leahy Scale: Fair     Standing balance support: Bilateral upper extremity supported Standing balance-Leahy Scale: Poor                              ADL Overall ADL's : Needs assistance/impaired     Grooming: Set up;Sitting   Upper Body Bathing: Set up;Sitting   Lower Body Bathing: Minimal assistance;Sit to/from stand;With adaptive equipment   Upper Body Dressing : Minimal assistance;Sitting   Lower Body Dressing: Minimal assistance;With adaptive equipment;Sit to/from stand   Toilet Transfer: Minimal assistance;Ambulation            Functional mobility during ADLs: Minimal assistance;Rolling walker (4 wheel walker) General ADL Comments: Pt uses AE at home.  She needs min A for balance for mobility related to ADLs.  She was unsteady walking to sink with 4 wheel walker. Pt had not eaten and had a busy day but she was willing to get up and work with OT     Vision                     Perception     Praxis      Pertinent Vitals/Pain LUE sore--had infiltration and PICC attempt earlier today     Hand Dominance     Extremity/Trunk Assessment Upper Extremity Assessment Upper Extremity Assessment: Generalized weakness (LUE sore--infiltrated and pt had attempt for PICC today)           Communication Communication Communication: HOH   Cognition Arousal/Alertness: Awake/alert Behavior During Therapy: WFL for tasks assessed/performed Overall Cognitive Status: Within Functional Limits for tasks assessed                     General Comments       Exercises       Shoulder Instructions      Home Living Family/patient expects to be discharged to:: Private residence Living Arrangements: Alone   Type of Home: [place]  Shower/Tub: Occupational psychologist: Standard     Home Equipment: Walker - 4 wheels;Grab bars - toilet;Grab bars - tub/shower;Shower seat   Additional Comments: has reacher and sock aid for adls      Prior Functioning/Environment Level of Independence: Independent with assistive device(s)             OT Diagnosis: Generalized weakness   OT Problem List: Decreased strength;Decreased activity tolerance;Impaired balance (sitting and/or standing)   OT Treatment/Interventions: Self-care/ADL training;Balance training;Patient/family education;Therapeutic activities    OT Goals(Current goals can be found in the care plan section) Acute Rehab OT Goals Patient Stated Goal: get strength back; return to being independent OT Goal  Formulation: With patient Time For Goal Achievement: 10/05/13 Potential to Achieve Goals: Fair ADL Goals Pt Will Perform Grooming: with supervision;standing Pt Will Perform Lower Body Bathing: with supervision;with adaptive equipment;sit to/from stand Pt Will Perform Lower Body Dressing: with supervision;with adaptive equipment;sit to/from stand Pt Will Transfer to Toilet: with supervision;ambulating;bedside commode Pt Will Perform Toileting - Clothing Manipulation and hygiene: with supervision;sit to/from stand  OT Frequency: Min 2X/week   Barriers to D/C:            Co-evaluation              End of Session Nurse Communication:  (called to ensure bed alarm was reset)  Activity Tolerance: Patient limited by fatigue Patient left: in bed;with call bell/phone within reach;with bed alarm set   Time: 1415-1447 OT Time Calculation (min): 32 min Charges:  OT General Charges $OT Visit: 1 Procedure OT Evaluation $Initial OT Evaluation Tier I: 1 Procedure OT Treatments $Therapeutic Activity: 8-22 mins G-Codes:    Lesle Chris Sep 28, 2013, 3:14 PM  Lesle Chris, OTR/L [phone] 2013/09/28

## 2013-09-21 NOTE — Progress Notes (Signed)
PT Cancellation Note  Patient Details Name: Latasha Proctor MRN: [id number] DOB: Sep 24, 1917   Cancelled Treatment:    Reason Eval/Treat Not Completed: Patient at procedure or test/unavailable Will check back as schedule permits.   Junius Argyle 09/21/2013, 11:16 AM Carmelia Bake, PT, DPT 09/21/2013 Pager: [phone]

## 2013-09-22 ENCOUNTER — Inpatient Hospital Stay (HOSPITAL_COMMUNITY): Payer: Medicare Other

## 2013-09-22 DIAGNOSIS — E43 Unspecified severe protein-calorie malnutrition: Secondary | ICD-10-CM

## 2013-09-22 DIAGNOSIS — R634 Abnormal weight loss: Secondary | ICD-10-CM

## 2013-09-22 LAB — BASIC METABOLIC PANEL
BUN: 26 mg/dL — AB (ref 6–23)
CALCIUM: 8.6 mg/dL (ref 8.4–10.5)
CO2: 30 meq/L (ref 19–32)
CREATININE: 1.12 mg/dL — AB (ref 0.50–1.10)
Chloride: 99 mEq/L (ref 96–112)
GFR calc Af Amer: 48 mL/min — ABNORMAL LOW (ref >90)
GFR, EST NON AFRICAN AMERICAN: 41 mL/min — AB (ref >90)
GLUCOSE: 144 mg/dL — AB (ref 70–99)
Potassium: 4.1 mEq/L (ref 3.7–5.3)
Sodium: 141 mEq/L (ref 137–147)

## 2013-09-22 LAB — PRO B NATRIURETIC PEPTIDE: Pro B Natriuretic peptide (BNP): 3909 pg/mL — ABNORMAL HIGH (ref 0–450)

## 2013-09-22 LAB — GLUCOSE, CAPILLARY
Glucose-Capillary: 111 mg/dL — ABNORMAL HIGH (ref 70–99)
Glucose-Capillary: 235 mg/dL — ABNORMAL HIGH (ref 70–99)
Glucose-Capillary: 112 mg/dL — ABNORMAL HIGH (ref 70–99)
Glucose-Capillary: 163 mg/dL — ABNORMAL HIGH (ref 70–99)

## 2013-09-22 MED ORDER — IOHEXOL 300 MG/ML  SOLN
50.0000 mL | Freq: Once | INTRAMUSCULAR | Status: AC | PRN
  Administered 2013-09-22: 50 mL via INTRAVENOUS

## 2013-09-22 NOTE — Progress Notes (Signed)
Subjective:  Feeling better. Some fatigue and DOE  Objective:  Temp:  [97.8 F (36.6 C)-98.1 F (36.7 C)] 98.1 F (36.7 C) (05/23 0626) Pulse Rate:  [103-113] 103 (05/23 0626) Resp:  [17] 17 (05/23 0626) BP: (126-154)/(59-90) 126/71 mmHg (05/23 0626) SpO2:  [90 %-95 %] 90 % (05/23 0626) Weight:  [115 lb 1.3 oz (52.2 kg)] 115 lb 1.3 oz (52.2 kg) (05/23 0626) Weight change:   Intake/Output from previous day: 05/22 0701 - 05/23 0700 In: 480 [P.O.:480] Out: 550 [Urine:550]  Intake/Output from this shift:    Physical Exam: General appearance: alert and no distress Neck: no adenopathy, no carotid bruit, no JVD, supple, symmetrical, trachea midline and thyroid not enlarged, symmetric, no tenderness/mass/nodules Lungs: clear to auscultation bilaterally Heart: irregularly irregular rhythm Extremities: extremities normal, atraumatic, no cyanosis or edema  Lab Results: Results for orders placed during the hospital encounter of 09/19/13 (from the past 48 hour(s))  TROPONIN I     Status: None   Collection Time    09/20/13 12:20 PM      Result Value Ref Range   Troponin I <0.30  <0.30 ng/mL   Comment:            Due to the release kinetics of cTnI,     a negative result within the first hours     of the onset of symptoms does not rule out     myocardial infarction with certainty.     If myocardial infarction is still suspected,     repeat the test at appropriate intervals.  GLUCOSE, CAPILLARY     Status: Abnormal   Collection Time    09/20/13  4:03 PM      Result Value Ref Range   Glucose-Capillary 125 (*) 70 - 99 mg/dL  GLUCOSE, CAPILLARY     Status: Abnormal   Collection Time    09/20/13  9:57 PM      Result Value Ref Range   Glucose-Capillary 103 (*) 70 - 99 mg/dL   Comment 1 Documented in Chart     Comment 2 Notify RN    HEPARIN LEVEL (UNFRACTIONATED)     Status: None   Collection Time    09/21/13  1:03 AM      Result Value Ref Range   Heparin Unfractionated  0.38  0.30 - 0.70 IU/mL   Comment:            IF HEPARIN RESULTS ARE BELOW     EXPECTED VALUES, AND PATIENT     DOSAGE HAS BEEN CONFIRMED,     SUGGEST FOLLOW UP TESTING     OF ANTITHROMBIN III LEVELS.  BASIC METABOLIC PANEL     Status: Abnormal   Collection Time    09/21/13  4:47 AM      Result Value Ref Range   Sodium 141  137 - 147 mEq/L   Potassium 4.7  3.7 - 5.3 mEq/L   Chloride 102  96 - 112 mEq/L   CO2 29  19 - 32 mEq/L   Glucose, Bld 113 (*) 70 - 99 mg/dL   BUN 24 (*) 6 - 23 mg/dL   Creatinine, Ser 1.00  0.50 - 1.10 mg/dL   Calcium 9.1  8.4 - 10.5 mg/dL   GFR calc non Af Amer 47 (*) >90 mL/min   GFR calc Af Amer 55 (*) >90 mL/min   Comment: (NOTE)     The eGFR has been calculated using the CKD EPI equation.  This calculation has not been validated in all clinical situations.     eGFR's persistently <90 mL/min signify possible Chronic Kidney     Disease.  CBC     Status: Abnormal   Collection Time    09/21/13  4:47 AM      Result Value Ref Range   WBC 4.9  4.0 - 10.5 K/uL   RBC 4.18  3.87 - 5.11 MIL/uL   Hemoglobin 11.2 (*) 12.0 - 15.0 g/dL   HCT 35.0 (*) 36.0 - 46.0 %   MCV 83.7  78.0 - 100.0 fL   MCH 26.8  26.0 - 34.0 pg   MCHC 32.0  30.0 - 36.0 g/dL   RDW 15.7 (*) 11.5 - 15.5 %   Platelets 147 (*) 150 - 400 K/uL  GLUCOSE, CAPILLARY     Status: Abnormal   Collection Time    09/21/13  6:45 AM      Result Value Ref Range   Glucose-Capillary 124 (*) 70 - 99 mg/dL  PRO B NATRIURETIC PEPTIDE     Status: Abnormal   Collection Time    09/21/13  9:05 AM      Result Value Ref Range   Pro B Natriuretic peptide (BNP) 3091.0 (*) 0 - 450 pg/mL  VITAMIN B12     Status: None   Collection Time    09/21/13  9:05 AM      Result Value Ref Range   Vitamin B-12 413  211 - 911 pg/mL   Comment: Performed at [facility]     Status: None   Collection Time    09/21/13  9:05 AM      Result Value Ref Range   Folate 13.8     Comment: (NOTE)     Reference  Ranges            Deficient:       0.4 - 3.3 ng/mL            Indeterminate:   3.4 - 5.4 ng/mL            Normal:              > 5.4 ng/mL     Performed at [facility] TIBC     Status: Abnormal   Collection Time    09/21/13  9:05 AM      Result Value Ref Range   Iron 31 (*) 42 - 135 ug/dL   TIBC 292  250 - 470 ug/dL   Saturation Ratios 11 (*) 20 - 55 %   UIBC 261  125 - 400 ug/dL   Comment: Performed at [facility]     Status: None   Collection Time    09/21/13  9:05 AM      Result Value Ref Range   Ferritin 24  10 - 291 ng/mL   Comment: Performed at [facility]     Status: None   Collection Time    09/21/13  9:05 AM      Result Value Ref Range   Retic Ct Pct 0.7  0.4 - 3.1 %   RBC. 4.25  3.87 - 5.11 MIL/uL   Retic Count, Manual 29.8  19.0 - 186.0 K/uL  HEPATIC FUNCTION PANEL     Status: Abnormal   Collection Time    09/21/13  9:05 AM      Result Value Ref Range   Total Protein  6.0  6.0 - 8.3 g/dL   Albumin 3.2 (*) 3.5 - 5.2 g/dL   AST 14  0 - 37 U/L   ALT 7  0 - 35 U/L   Alkaline Phosphatase 52  39 - 117 U/L   Total Bilirubin 0.6  0.3 - 1.2 mg/dL   Bilirubin, Direct <0.2  0.0 - 0.3 mg/dL   Indirect Bilirubin NOT CALCULATED  0.3 - 0.9 mg/dL  PREALBUMIN     Status: Abnormal   Collection Time    09/21/13  9:05 AM      Result Value Ref Range   Prealbumin 10.6 (*) 17.0 - 34.0 mg/dL   Comment: Performed at [facility] (UNFRACTIONATED)     Status: None   Collection Time    09/21/13  9:05 AM      Result Value Ref Range   Heparin Unfractionated 0.67  0.30 - 0.70 IU/mL   Comment:            IF HEPARIN RESULTS ARE BELOW     EXPECTED VALUES, AND PATIENT     DOSAGE HAS BEEN CONFIRMED,     SUGGEST FOLLOW UP TESTING     OF ANTITHROMBIN III LEVELS.  GLUCOSE, CAPILLARY     Status: Abnormal   Collection Time    09/21/13  4:06 PM      Result Value Ref Range   Glucose-Capillary 156 (*) 70 -  99 mg/dL  GLUCOSE, CAPILLARY     Status: Abnormal   Collection Time    09/21/13  9:19 PM      Result Value Ref Range   Glucose-Capillary 162 (*) 70 - 99 mg/dL  BASIC METABOLIC PANEL     Status: Abnormal   Collection Time    09/22/13  2:58 AM      Result Value Ref Range   Sodium 141  137 - 147 mEq/L   Potassium 4.1  3.7 - 5.3 mEq/L   Chloride 99  96 - 112 mEq/L   CO2 30  19 - 32 mEq/L   Glucose, Bld 144 (*) 70 - 99 mg/dL   BUN 26 (*) 6 - 23 mg/dL   Creatinine, Ser 1.12 (*) 0.50 - 1.10 mg/dL   Calcium 8.6  8.4 - 10.5 mg/dL   GFR calc non Af Amer 41 (*) >90 mL/min   GFR calc Af Amer 48 (*) >90 mL/min   Comment: (NOTE)     The eGFR has been calculated using the CKD EPI equation.     This calculation has not been validated in all clinical situations.     eGFR's persistently <90 mL/min signify possible Chronic Kidney     Disease.  PRO B NATRIURETIC PEPTIDE     Status: Abnormal   Collection Time    09/22/13  2:58 AM      Result Value Ref Range   Pro B Natriuretic peptide (BNP) 3909.0 (*) 0 - 450 pg/mL  GLUCOSE, CAPILLARY     Status: Abnormal   Collection Time    09/22/13  6:33 AM      Result Value Ref Range   Glucose-Capillary 111 (*) 70 - 99 mg/dL    Imaging: Imaging results have been reviewed  Tele: Afib with CVR  Assessment/Plan:   1. Principal Problem: 2.   Atrial fibrillation with RVR 3. Active Problems: 4.   Chest pain 5.   Weight loss, unintentional 6.   Bilateral pleural effusion 7.   Protein-calorie malnutrition, severe 8.  Time Spent Directly with Patient:  20 minutes  Length of Stay:  LOS: 3 days   Pt in Afib with CVR (HR low 100s) on PO Cardizem and Eliquis. Exam benign. Plans for SNF ([facility]) as transition to going home. Plan is for OP DCCV in approximately 4 weeks. Her Cardizem can be titrated from 180 to 240 mg for HR if BP allows. Will S/O. Call if we can be of further assistance. We will arrange close OP follow up   Lorretta Harp 09/22/2013, 11:24 AM

## 2013-09-22 NOTE — Progress Notes (Signed)
Subjective: Feels wiped out.  "I realized this morning I could not go home".  Agreeable to SNF rehab placement.    Objective: Vital signs in last 24 hours: Temp:  [97.8 F (36.6 C)-98.1 F (36.7 C)] 98.1 F (36.7 C) (05/23 0626) Pulse Rate:  [98-113] 103 (05/23 0626) Resp:  [17] 17 (05/23 0626) BP: (126-170)/(59-90) 126/71 mmHg (05/23 0626) SpO2:  [90 %-95 %] 90 % (05/23 0626) Weight:  [52.2 kg (115 lb 1.3 oz)] 52.2 kg (115 lb 1.3 oz) (05/23 0626) Weight change:  Last BM Date: 09/18/13  CBG (last 3)   Recent Labs  09/21/13 1606 09/21/13 2119 09/22/13 0633  GLUCAP 156* 162* 111*    Intake/Output from previous day: 05/22 0701 - 05/23 0700 In: 480 [P.O.:480] Out: 550 [Urine:550] Intake/Output this shift:    General appearance: alert and frail Eyes: no scleral icterus Throat: oropharynx moist without erythema Resp: clear to auscultation bilaterally Cardio: irregularly irregular rhythm GI: soft, non-tender; bowel sounds normal; no masses,  no organomegaly Extremities: no clubbing, cyanosis or edema   Lab Results:  Recent Labs  09/21/13 0447 09/22/13 0258  NA 141 141  K 4.7 4.1  CL 102 99  CO2 29 30  GLUCOSE 113* 144*  BUN 24* 26*  CREATININE 1.00 1.12*  CALCIUM 9.1 8.6    Recent Labs  09/19/13 1515 09/21/13 0905  AST 17 14  ALT 10 7  ALKPHOS 61 52  BILITOT 0.8 0.6  PROT 6.5 6.0  ALBUMIN 3.6 3.2*    Recent Labs  09/20/13 0320 09/21/13 0447  WBC 3.3* 4.9  HGB 9.9* 11.2*  HCT 30.8* 35.0*  MCV 83.9 83.7  PLT 123* 147*   Lab Results  Component Value Date   INR 1.51* 05/27/2010    Recent Labs  09/19/13 1820 09/19/13 2255 09/20/13 1220  TROPONINI <0.30 <0.30 <0.30    Recent Labs  09/19/13 1515  TSH 2.130    Recent Labs  09/21/13 0905  VITAMINB12 413  FOLATE 13.8  FERRITIN 24  TIBC 292  IRON 31*  RETICCTPCT 0.7   Prealbumin 10.6  Studies/Results: Dg Chest 2 View  09/21/2013   CLINICAL DATA:  Difficulty breathing;  pleural effusion  EXAM: CHEST  2 VIEW  COMPARISON:  January 31, 2012  FINDINGS: There is underlying emphysematous change. There is patchy infiltrate left base with small left effusion. Elsewhere lungs are clear. Heart is upper normal in size with normal pulmonary vascularity. No adenopathy. There is screw and plate fixation in the mid thoracic region. There is degenerative change in the thoracic spine. There are skin folds on each side but no apparent pneumothorax.  IMPRESSION: Small left effusion with patchy left base infiltrate. Underlying emphysematous change.   Electronically Signed   By: Lowella Grip M.D.   On: 09/21/2013 13:43   Nm Myocar Multi W/spect W/wall Motion / Ef  09/21/2013   CLINICAL DATA:  Chest pain.  EXAM: MYOCARDIAL IMAGING WITH SPECT (REST AND PHARMACOLOGIC-STRESS)  GATED LEFT VENTRICULAR WALL MOTION STUDY  LEFT VENTRICULAR EJECTION FRACTION  TECHNIQUE: Standard myocardial SPECT imaging was performed after resting intravenous injection of 10 mCi Tc-2m sestamibi. Subsequently, intravenous infusion of Lexiscan was performed under the supervision of the Cardiology staff. At peak effect of the drug, 30 mCi Tc-81m sestamibi was injected intravenously and standard myocardial SPECT imaging was performed. Quantitative gated imaging was also performed to evaluate left ventricular wall motion, and estimate left ventricular ejection fraction.  COMPARISON:  None.  FINDINGS: The SPECT stress and  rest images demonstrate no fixed or reversible defects to suggest infarction or ischemia. The gated SPECT images demonstrate normal wall motion and myocardial thickening with contraction. The ejection fraction was calculated at 53%.  IMPRESSION: No findings for infarction or ischemia.  Normal wall motion and myocardial thickening with contraction.  Ejection fraction calculated at 53%   Electronically Signed   By: Kalman Jewels M.D.   On: 09/21/2013 15:35     Medications: Scheduled: . apixaban  2.5  mg Oral BID  . diltiazem  180 mg Oral Daily  . feeding supplement (ENSURE COMPLETE)  237 mL Oral Q1500  . furosemide  20 mg Oral Daily  . insulin aspart  0-5 Units Subcutaneous QHS  . insulin aspart  0-9 Units Subcutaneous TID WC  . insulin aspart  3 Units Subcutaneous TID WC   Continuous:   Assessment/Plan: Principal Problem:  1. Atrial fibrillation with RVR- transitioned to oral Cardizem and Eliquis.  Possible DCCV as outpatient per Cardiology Active Problems:  2. Chest pain, atypical- negative cardiolite.  No recurrence.  Low suspicion for PE and will be on anticoagulation regardless. 3. Severe Protein Calorie Malnutrition/Weight loss, unintentional- TSH normal. SPEP pending. Obtain Chest CT to rule out malignancy.  Very low prealbumen.  Ensure added.  4. Left Pleural Effusions- CXR reveals small left effusion.  Too small for thoracentesis.  Will evaluate left basilar infiltrate further with CT in absence of infectious symptoms and weight loss.   5. Anemia/Thrombocytopenia- stable. Low reticulocyte count but otherwise normal anemia panel. No B symptoms other than weight loss, fatigue.   6. Disposition- Anticipate discharge to SNF Rehab on Monday or Tuesday.  Prefers [place].   LOS: 3 days   Latasha Proctor 09/22/2013, 8:27 AM

## 2013-09-22 NOTE — Evaluation (Signed)
Physical Therapy Evaluation Patient Details Name: Latasha Proctor MRN: [id number] DOB: Jul 06, 1917 Today's Date: 09/22/2013   History of Present Illness  Pt is a 80 year old female who was admitted with c/o weakness, weight loss.  She has A Fib with RVR  Clinical Impression  Patient lives alone and does not have adequate support to safely discharge home due to decreased balance and generalized weakness along with advanced age. Recommend skilled nursing facility for post-acute therapy needs. Pt and daughter in agreement.     Follow Up Recommendations SNF    Equipment Recommendations  None recommended by PT    Recommendations for Other Services       Precautions / Restrictions Precautions Precautions: Fall      Mobility  Bed Mobility   Bed Mobility: Supine to Sit     Supine to sit: Min guard     General bed mobility comments: extra time, hob raised, used rail, cues for scooting to EOB  Transfers Overall transfer level: Needs assistance Equipment used: 4-wheeled walker Transfers: Sit to/from Stand Sit to Stand: Min guard         General transfer comment: cues for safety with rollator  Ambulation/Gait Ambulation/Gait assistance: Min assist Ambulation Distance (Feet): 90 Feet Assistive device: 4-wheeled walker Gait Pattern/deviations: Decreased step length - right;Decreased step length - left;Trunk flexed     General Gait Details: Pt with decreased step length and slight limp which daughter reprts is pre-morbid.  Pt with no LOB, but with unsteadiness noted especially posteriorly  Stairs            Wheelchair Mobility    Modified Rankin (Stroke Patients Only)       Balance   Sitting-balance support: Feet supported Sitting balance-Leahy Scale: Fair     Standing balance support: Bilateral upper extremity supported Standing balance-Leahy Scale: Poor                               Pertinent Vitals/Pain No c/o pain    Home Living  Family/patient expects to be discharged to:: Skilled nursing facility     Type of Home:  (steps to enter home)                Prior Function Level of Independence: Independent with assistive device(s)         Comments: rollator     Hand Dominance        Extremity/Trunk Assessment   Upper Extremity Assessment: Defer to OT evaluation           Lower Extremity Assessment: Generalized weakness      Cervical / Trunk Assessment: Kyphotic  Communication   Communication: HOH  Cognition Arousal/Alertness: Awake/alert Behavior During Therapy: WFL for tasks assessed/performed Overall Cognitive Status: Within Functional Limits for tasks assessed                      General Comments      Exercises        Assessment/Plan    PT Assessment Patient needs continued PT services  PT Diagnosis Difficulty walking   PT Problem List Decreased strength;Decreased range of motion;Decreased activity tolerance;Decreased balance;Decreased mobility  PT Treatment Interventions Gait training;Functional mobility training;Therapeutic activities;Therapeutic exercise   PT Goals (Current goals can be found in the Care Plan section) Acute Rehab PT Goals Patient Stated Goal: To go to rehab to get stronger. PT Goal Formulation: With patient/family Time For Goal Achievement:  09/29/13 Potential to Achieve Goals: Good    Frequency Min 3X/week   Barriers to discharge Decreased caregiver support      Co-evaluation               End of Session Equipment Utilized During Treatment: Gait belt Activity Tolerance: Patient tolerated treatment well Patient left: in chair;with family/visitor present Nurse Communication: Mobility status (nurse tech)         Time: 3299-2426 PT Time Calculation (min): 28 min   Charges:     PT Treatments $Gait Training: 8-22 mins   PT G Codes:          Galen Manila 2013-10-16, 10:30 AM

## 2013-09-22 NOTE — Evaluation (Deleted)
Physical Therapy Evaluation Patient Details Name: Latasha Proctor MRN: [id number] DOB: 18-Nov-1917 Today's Date: 09/22/2013   History of Present Illness  Pt is a 80 year old female who was admitted with c/o weakness, weight loss.  She has A Fib with RVR  Clinical Impression  Patient lives alone and does not have adequate support to safely discharge home due to decreased balance and generalized weakness along with advanced age. Recommend skilled nursing facility for post-acute therapy needs.  Pt and daughter in agreement.     Follow Up Recommendations      Equipment Recommendations       Recommendations for Other Services       Precautions / Restrictions Precautions Precautions: Fall      Mobility  Bed Mobility   Bed Mobility: Supine to Sit     Supine to sit: Min guard     General bed mobility comments: extra time, hob raised, used rail, cues for scooting to EOB  Transfers Overall transfer level: Needs assistance Equipment used: 4-wheeled walker Transfers: Sit to/from Stand Sit to Stand: Min guard         General transfer comment: cues for safety with rollator  Ambulation/Gait Ambulation/Gait assistance: Min assist Ambulation Distance (Feet): 90 Feet Assistive device: 4-wheeled walker Gait Pattern/deviations: Decreased step length - right;Decreased step length - left;Trunk flexed     General Gait Details: Pt with decreased step length and slight limp which daughter reprts is pre-morbid.  Pt with no LOB, but with unsteadiness noted especially posteriorly  Stairs            Wheelchair Mobility    Modified Rankin (Stroke Patients Only)       Balance   Sitting-balance support: Feet supported Sitting balance-Leahy Scale: Fair     Standing balance support: Bilateral upper extremity supported Standing balance-Leahy Scale: Poor                               Pertinent Vitals/Pain Denies pain    Home Living Family/patient expects to  be discharged to:: Skilled nursing facility     Type of Home:  (steps to enter home)                Prior Function Level of Independence: Independent with assistive device(s)         Comments: rollator     Hand Dominance        Extremity/Trunk Assessment   Upper Extremity Assessment: Defer to OT evaluation           Lower Extremity Assessment: Generalized weakness      Cervical / Trunk Assessment: Kyphotic  Communication   Communication: HOH  Cognition Arousal/Alertness: Awake/alert Behavior During Therapy: WFL for tasks assessed/performed Overall Cognitive Status: Within Functional Limits for tasks assessed                      General Comments      Exercises        Assessment/Plan    PT Assessment    PT Diagnosis     PT Problem List    PT Treatment Interventions     PT Goals (Current goals can be found in the Care Plan section) Acute Rehab PT Goals Patient Stated Goal: To go to rehab to get stronger. PT Goal Formulation: With patient/family Time For Goal Achievement: 09/29/13 Potential to Achieve Goals: Good    Frequency     Barriers  to discharge        Co-evaluation               End of Session                 Time: 4268-3419 PT Time Calculation (min): 28 min   Charges:     PT Treatments $Gait Training: 8-22 mins   PT G Codes:          Galen Manila 10-20-2013, 10:25 AM

## 2013-09-23 LAB — CBC WITH DIFFERENTIAL/PLATELET
BASOS PCT: 0 % (ref 0–1)
Basophils Absolute: 0 10*3/uL (ref 0.0–0.1)
EOS ABS: 0 10*3/uL (ref 0.0–0.7)
EOS PCT: 0 % (ref 0–5)
HCT: 31.8 % — ABNORMAL LOW (ref 36.0–46.0)
HEMOGLOBIN: 9.8 g/dL — AB (ref 12.0–15.0)
Lymphocytes Relative: 7 % — ABNORMAL LOW (ref 12–46)
Lymphs Abs: 0.5 10*3/uL — ABNORMAL LOW (ref 0.7–4.0)
MCH: 26.3 pg (ref 26.0–34.0)
MCHC: 30.8 g/dL (ref 30.0–36.0)
MCV: 85.5 fL (ref 78.0–100.0)
MONO ABS: 0.4 10*3/uL (ref 0.1–1.0)
MONOS PCT: 5 % (ref 3–12)
NEUTROS PCT: 88 % — AB (ref 43–77)
Neutro Abs: 6.2 10*3/uL (ref 1.7–7.7)
Platelets: 132 10*3/uL — ABNORMAL LOW (ref 150–400)
RBC: 3.72 MIL/uL — ABNORMAL LOW (ref 3.87–5.11)
RDW: 16 % — ABNORMAL HIGH (ref 11.5–15.5)
WBC: 7.1 10*3/uL (ref 4.0–10.5)

## 2013-09-23 LAB — GLUCOSE, CAPILLARY
GLUCOSE-CAPILLARY: 144 mg/dL — AB (ref 70–99)
Glucose-Capillary: 207 mg/dL — ABNORMAL HIGH (ref 70–99)
Glucose-Capillary: 163 mg/dL — ABNORMAL HIGH (ref 70–99)

## 2013-09-23 LAB — BASIC METABOLIC PANEL
BUN: 30 mg/dL — ABNORMAL HIGH (ref 6–23)
CO2: 32 meq/L (ref 19–32)
Calcium: 8.9 mg/dL (ref 8.4–10.5)
Chloride: 97 mEq/L (ref 96–112)
Creatinine, Ser: 1.26 mg/dL — ABNORMAL HIGH (ref 0.50–1.10)
GFR calc Af Amer: 41 mL/min — ABNORMAL LOW (ref >90)
GFR, EST NON AFRICAN AMERICAN: 36 mL/min — AB (ref >90)
GLUCOSE: 141 mg/dL — AB (ref 70–99)
Potassium: 4.1 mEq/L (ref 3.7–5.3)
Sodium: 138 mEq/L (ref 137–147)

## 2013-09-23 MED ORDER — LEVOFLOXACIN 500 MG PO TABS
500.0000 mg | ORAL_TABLET | ORAL | Status: DC
  Administered 2013-09-23 – 2013-09-25 (×2): 500 mg via ORAL
  Filled 2013-09-23 (×2): qty 1

## 2013-09-23 MED ORDER — DILTIAZEM HCL ER COATED BEADS 240 MG PO CP24
240.0000 mg | ORAL_CAPSULE | Freq: Every day | ORAL | Status: DC
  Administered 2013-09-23 – 2013-09-25 (×3): 240 mg via ORAL
  Filled 2013-09-23 (×3): qty 1

## 2013-09-23 NOTE — Discharge Instructions (Signed)
Information on my medicine - ELIQUIS (apixaban)  This medication education was reviewed with me or my healthcare representative as part of my discharge preparation.  The pharmacist that spoke with me during my hospital stay was:  Wayland Salinas, [facility]  Why was Eliquis prescribed for you? Eliquis was prescribed for you to reduce the risk of a blood clot forming that can cause a stroke if you have a medical condition called atrial fibrillation (a type of irregular heartbeat).  What do You need to know about Eliquis ? Take your Eliquis TWICE DAILY - one tablet in the morning and one tablet in the evening with or without food. If you have difficulty swallowing the tablet whole please discuss with your pharmacist how to take the medication safely.  Take Eliquis exactly as prescribed by your doctor and DO NOT stop taking Eliquis without talking to the doctor who prescribed the medication.  Stopping may increase your risk of developing a stroke.  Refill your prescription before you run out.  After discharge, you should have regular check-up appointments with your healthcare provider that is prescribing your Eliquis.  In the future your dose may need to be changed if your kidney function or weight changes by a significant amount or as you get older.  What do you do if you miss a dose? If you miss a dose, take it as soon as you remember on the same day and resume taking twice daily.  Do not take more than one dose of ELIQUIS at the same time to make up a missed dose.  Important Safety Information A possible side effect of Eliquis is bleeding. You should call your healthcare provider right away if you experience any of the following:   Bleeding from an injury or your nose that does not stop.   Unusual colored urine (red or dark brown) or unusual colored stools (red or black).   Unusual bruising for unknown reasons.   A serious fall or if you hit your head (even if there is no  bleeding).  Some medicines may interact with Eliquis and might increase your risk of bleeding or clotting while on Eliquis. To help avoid this, consult your healthcare provider or pharmacist prior to using any new prescription or non-prescription medications, including herbals, vitamins, non-steroidal anti-inflammatory drugs (NSAIDs) and supplements.  This website has more information on Eliquis (apixaban): [email or web address].no.

## 2013-09-23 NOTE — Progress Notes (Signed)
Subjective: No new complaints.  She does report some difficulty swallowing recently.  Occasionally has to "cough" food up due to feeling that it won't go down or is "going down the wrong tube".  Reports 2-3 weeks of thick sputum production.  Shortness of breath is stable.  Swelling in legs improved.    Objective: Vital signs in last 24 hours: Temp:  [97.5 F (36.4 C)-98.4 F (36.9 C)] 97.5 F (36.4 C) (05/24 0526) Pulse Rate:  [67-106] 106 (05/24 0526) Resp:  [18-19] 18 (05/24 0526) BP: (92-121)/(43-49) 121/45 mmHg (05/24 0526) SpO2:  [94 %-96 %] 95 % (05/24 0526) Weight:  [51.3 kg (113 lb 1.5 oz)] 51.3 kg (113 lb 1.5 oz) (05/24 0526) Weight change: -0.9 kg (-1 lb 15.7 oz) Last BM Date: 09/18/13  CBG (last 3)   Recent Labs  09/22/13 1641 09/22/13 2110 09/23/13 0605  GLUCAP 112* 163* 144*    Intake/Output from previous day: 05/23 0701 - 05/24 0700 In: 360 [P.O.:360] Out: 600 [Urine:600] Intake/Output this shift:    General appearance: alert and no distress Eyes: no scleral icterus Throat: oropharynx moist without erythema Resp: clear to auscultation bilaterally and minimal left basilar crackles Cardio: irregularly irregular rhythm GI: soft, non-tender; bowel sounds normal; no masses,  no organomegaly Extremities: no clubbing, cyanosis; trace edema   Lab Results:  Recent Labs  09/22/13 0258 09/23/13 0455  NA 141 138  K 4.1 4.1  CL 99 97  CO2 30 32  GLUCOSE 144* 141*  BUN 26* 30*  CREATININE 1.12* 1.26*  CALCIUM 8.6 8.9    Recent Labs  09/21/13 0905  AST 14  ALT 7  ALKPHOS 52  BILITOT 0.6  PROT 6.0  ALBUMIN 3.2*    Recent Labs  09/21/13 0447 09/23/13 0455  WBC 4.9 7.1  NEUTROABS  --  6.2  HGB 11.2* 9.8*  HCT 35.0* 31.8*  MCV 83.7 85.5  PLT 147* 132*   Lab Results  Component Value Date   INR 1.51* 05/27/2010    Recent Labs  09/20/13 1220  TROPONINI <0.30   No results found for this basename: TSH, T4TOTAL, FREET3, T3FREE, THYROIDAB,   in the last 72 hours  Recent Labs  09/21/13 0905  VITAMINB12 413  FOLATE 13.8  FERRITIN 24  TIBC 292  IRON 31*  RETICCTPCT 0.7    Studies/Results: Dg Chest 2 View  09/21/2013   CLINICAL DATA:  Difficulty breathing; pleural effusion  EXAM: CHEST  2 VIEW  COMPARISON:  January 31, 2012  FINDINGS: There is underlying emphysematous change. There is patchy infiltrate left base with small left effusion. Elsewhere lungs are clear. Heart is upper normal in size with normal pulmonary vascularity. No adenopathy. There is screw and plate fixation in the mid thoracic region. There is degenerative change in the thoracic spine. There are skin folds on each side but no apparent pneumothorax.  IMPRESSION: Small left effusion with patchy left base infiltrate. Underlying emphysematous change.   Electronically Signed   By: Lowella Grip M.D.   On: 09/21/2013 13:43   Ct Chest W Contrast  09/22/2013   CLINICAL DATA:  Weight loss. Abnormal chest radiograph small left pleural effusion visualized.  EXAM: CT CHEST WITH CONTRAST  TECHNIQUE: Multidetector CT imaging of the chest was performed during intravenous contrast administration.  CONTRAST:  42mL OMNIPAQUE IOHEXOL 300 MG/ML  SOLN  COMPARISON:  Chest radiograph 09/21/2013. CT abdomen pelvis 09/20/1998  FINDINGS: Heart is moderately enlarged. Both atria and the left ventricle are dilated. Left ventricular  wall thickening noted. There is calcification of the mitral annulus. Faint calcification noted in the region of the aortic valve.  Thoracic aorta is normal in caliber and contains moderate atherosclerotic calcification. There is some calcification of the left coronary artery.  There are bilateral moderately-sized symmetric and simple appearing bilateral pleural effusions. Very small pericardial effusion seen inferiorly.  Negative for supraclavicular axillary, or hilar lymphadenopathy. There is a mildly prominent 13 mm precarinal mediastinal lymph node. Esophagus  is unremarkable.  Lung discuss that lung windows demonstrate bilateral scattered patchy airspace opacities. These are most prominent in the lower lobes near the lung bases, the right middle lobe, and lingula.  There is a single discrete 5 mm oval right lower lobe pulmonary nodule on image number 31 of series 3. No pulmonary mass is seen.  Postsurgical changes of posterior spinal fusion spanning T6 through T10. Vertebral bodies are normal in height and alignment. No suspicious bony abnormality.  Upper abdomen: There are multiple circumscribed water density lesions in the liver consistent with hepatic cysts, unchanged on recent CT abdomen 09/19/2013. Slight thickening of both adrenal glands is symmetric and stable. No discrete adrenal mass.  IMPRESSION: 1. Bilateral moderate-sized pleural effusions.  These appear simple. 2. Patchy airspace opacities in all lobes of the lungs, with a basilar predominance. This could reflect multilobar pneumonia or a slightly atypical presentation of pulmonary edema. 3. 5 mm discrete pulmonary nodule right lower lobe. If the patient is at high risk for bronchogenic carcinoma, follow-up chest CT at 6-12 months is recommended. If the patient is at low risk for bronchogenic carcinoma, follow-up chest CT at 12 months is recommended. This recommendation follows the consensus statement: Guidelines for Management of Small Pulmonary Nodules Detected on CT Scans: A Statement from the [place] as published in Radiology 2005;237:395-400. 4. Single mildly prominent mediastinal lymph node, likely reactive. 5. Cardiomegaly with small pericardial effusion. 6. Coronary artery atherosclerotic calcification. 7. Hepatic cysts.   Electronically Signed   By: Curlene Dolphin M.D.   On: 09/22/2013 12:56   Nm Myocar Multi W/spect W/wall Motion / Ef  09/21/2013   CLINICAL DATA:  Chest pain.  EXAM: MYOCARDIAL IMAGING WITH SPECT (REST AND PHARMACOLOGIC-STRESS)  GATED LEFT VENTRICULAR WALL MOTION STUDY   LEFT VENTRICULAR EJECTION FRACTION  TECHNIQUE: Standard myocardial SPECT imaging was performed after resting intravenous injection of 10 mCi Tc-37m sestamibi. Subsequently, intravenous infusion of Lexiscan was performed under the supervision of the Cardiology staff. At peak effect of the drug, 30 mCi Tc-51m sestamibi was injected intravenously and standard myocardial SPECT imaging was performed. Quantitative gated imaging was also performed to evaluate left ventricular wall motion, and estimate left ventricular ejection fraction.  COMPARISON:  None.  FINDINGS: The SPECT stress and rest images demonstrate no fixed or reversible defects to suggest infarction or ischemia. The gated SPECT images demonstrate normal wall motion and myocardial thickening with contraction. The ejection fraction was calculated at 53%.  IMPRESSION: No findings for infarction or ischemia.  Normal wall motion and myocardial thickening with contraction.  Ejection fraction calculated at 53%   Electronically Signed   By: Kalman Jewels M.D.   On: 09/21/2013 15:35     Medications: Scheduled: . apixaban  2.5 mg Oral BID  . diltiazem  180 mg Oral Daily  . feeding supplement (ENSURE COMPLETE)  237 mL Oral Q1500  . furosemide  20 mg Oral Daily  . insulin aspart  0-5 Units Subcutaneous QHS  . insulin aspart  0-9 Units Subcutaneous TID WC  .  insulin aspart  3 Units Subcutaneous TID WC   Continuous:   Assessment/Plan: Principal Problem:  1. Atrial fibrillation with RVR- transitioned to oral Cardizem and Eliquis. Possible DCCV as outpatient per Cardiology.  Will increase Cardizem to 240mg  daily due to increased HR.  Active Problems:  2. Chest pain, atypical- negative cardiolite. No recurrence. Low suspicion for PE and will be on anticoagulation regardless.  3. Severe Protein Calorie Malnutrition/Weight loss, unintentional- TSH normal. SPEP pending. CT Chest/Abd/Pelvis negative for malignancy. Very low prealbumen. Ensure added.  4.  Left Pleural Effusions- Small-moderate bilateral effusion- appear simple on CT. May be related to low albumen state. 5. Anemia/Thrombocytopenia- stable. Low reticulocyte count but otherwise normal anemia panel. No B symptoms other than weight loss, fatigue.  6. Possible Atypical Pneumonia- Chest CT suggests possible atypical pneumonia (raises possibility of aspiration vs. MAI).  Will cover with Levaquin though minimal infectious symptoms other than cough and sputum production.  Swallow evaluation due to dysphagia. 7. Disposition- Anticipate discharge to SNF Rehab on Monday or Tuesday. Prefers [facility].    LOS: 4 days   Marton Redwood 09/23/2013, 10:35 AM

## 2013-09-24 ENCOUNTER — Inpatient Hospital Stay (HOSPITAL_COMMUNITY): Payer: Medicare Other

## 2013-09-24 LAB — CBC WITH DIFFERENTIAL/PLATELET
Basophils Absolute: 0 10*3/uL (ref 0.0–0.1)
Basophils Relative: 0 % (ref 0–1)
EOS PCT: 1 % (ref 0–5)
Eosinophils Absolute: 0.1 10*3/uL (ref 0.0–0.7)
HEMATOCRIT: 30.4 % — AB (ref 36.0–46.0)
HEMOGLOBIN: 9.9 g/dL — AB (ref 12.0–15.0)
LYMPHS ABS: 0.5 10*3/uL — AB (ref 0.7–4.0)
LYMPHS PCT: 9 % — AB (ref 12–46)
MCH: 27.3 pg (ref 26.0–34.0)
MCHC: 32.6 g/dL (ref 30.0–36.0)
MCV: 84 fL (ref 78.0–100.0)
MONO ABS: 0.5 10*3/uL (ref 0.1–1.0)
Monocytes Relative: 9 % (ref 3–12)
Neutro Abs: 4.1 10*3/uL (ref 1.7–7.7)
Neutrophils Relative %: 81 % — ABNORMAL HIGH (ref 43–77)
Platelets: 134 10*3/uL — ABNORMAL LOW (ref 150–400)
RBC: 3.62 MIL/uL — AB (ref 3.87–5.11)
RDW: 15.6 % — ABNORMAL HIGH (ref 11.5–15.5)
WBC: 5.1 10*3/uL (ref 4.0–10.5)

## 2013-09-24 LAB — BASIC METABOLIC PANEL
BUN: 33 mg/dL — ABNORMAL HIGH (ref 6–23)
CHLORIDE: 99 meq/L (ref 96–112)
CO2: 33 mEq/L — ABNORMAL HIGH (ref 19–32)
Calcium: 8.8 mg/dL (ref 8.4–10.5)
Creatinine, Ser: 1.24 mg/dL — ABNORMAL HIGH (ref 0.50–1.10)
GFR calc Af Amer: 42 mL/min — ABNORMAL LOW (ref >90)
GFR calc non Af Amer: 36 mL/min — ABNORMAL LOW (ref >90)
Glucose, Bld: 191 mg/dL — ABNORMAL HIGH (ref 70–99)
Potassium: 5.3 mEq/L (ref 3.7–5.3)
SODIUM: 138 meq/L (ref 137–147)

## 2013-09-24 LAB — GLUCOSE, CAPILLARY
GLUCOSE-CAPILLARY: 141 mg/dL — AB (ref 70–99)
GLUCOSE-CAPILLARY: 88 mg/dL (ref 70–99)
Glucose-Capillary: 227 mg/dL — ABNORMAL HIGH (ref 70–99)
Glucose-Capillary: 171 mg/dL — ABNORMAL HIGH (ref 70–99)

## 2013-09-24 MED ORDER — INSULIN GLARGINE 100 UNIT/ML ~~LOC~~ SOLN
5.0000 [IU] | Freq: Every day | SUBCUTANEOUS | Status: DC
  Administered 2013-09-24: 5 [IU] via SUBCUTANEOUS
  Filled 2013-09-24 (×2): qty 0.05

## 2013-09-24 MED ORDER — STARCH (THICKENING) PO POWD
ORAL | Status: DC | PRN
  Filled 2013-09-24: qty 227

## 2013-09-24 MED ORDER — METOPROLOL TARTRATE 12.5 MG HALF TABLET
12.5000 mg | ORAL_TABLET | Freq: Two times a day (BID) | ORAL | Status: DC
  Administered 2013-09-24 – 2013-09-25 (×3): 12.5 mg via ORAL
  Filled 2013-09-24 (×5): qty 1

## 2013-09-24 NOTE — Progress Notes (Signed)
ANTICOAGULATION CONSULT NOTE - Follow Up Consult  Pharmacy Consult for Heparin>>Eliquis Indication: atrial fibrillation  No Known Allergies  Patient Measurements: Height: 5\' 3"  (160 cm) Weight: 117 lb 8.1 oz (53.3 kg) IBW/kg (Calculated) : 52.4 Heparin Dosing Weight: 52.4 kg  Vital Signs: Temp: 98.4 F (36.9 C) (05/25 0453) Temp src: Oral (05/25 0453) BP: 143/74 mmHg (05/25 1145) Pulse Rate: 106 (05/25 1145)  Labs:  Recent Labs  09/22/13 0258 09/23/13 0455 09/24/13 0406  HGB  --  9.8* 9.9*  HCT  --  31.8* 30.4*  PLT  --  132* 134*  CREATININE 1.12* 1.26* 1.24*    Estimated Creatinine Clearance: 23.4 ml/min (by C-G formula based on Cr of 1.24).   Assessment: 80 yo F admitted with afib with RVR. Started on Eliquis 2.5 mg BID. Dose is appropriate for age > 7 and weight < 60 kg. Renal function stable, scr 1.24. hgb 9.9, plt 134, both are low but stable.    Plan:  Conitnue Eliquis 2.5mg  BID Pharmacy sign off, will monitor peripherally   Maryanna Shape, PharmD, BCPS  Clinical Pharmacist  Pager: [phone]  09/24/2013,11:58 AM

## 2013-09-24 NOTE — Progress Notes (Signed)
Clinical Social Work Department BRIEF PSYCHOSOCIAL ASSESSMENT 09/24/2013  Patient:  Latasha Proctor, Latasha Proctor     Account Number:  [id number]     Admit date:  09/19/2013  Clinical Social Worker:  Megan Salon  Date/Time:  09/24/2013 12:08 PM  Referred by:  Physician  Date Referred:  09/24/2013 Referred for  SNF Placement   Other Referral:   Interview type:  Other - See comment Other interview type:   CSW spoke to patient and patient's daughter by bedside    PSYCHOSOCIAL DATA Living Status:  ALONE Admitted from facility:   Level of care:   Primary support name:  Kaliyan Osbourn Primary support relationship to patient:  CHILD, ADULT Degree of support available:   Good    CURRENT CONCERNS Current Concerns  Post-Acute Placement   Other Concerns:    SOCIAL WORK ASSESSMENT / PLAN Clinical Social Worker received referral for SNF placement at d/c. CSW introduced self and explained reason for visit. Patient had visitor by bedside, patient's daughter. CSW explained SNF process to patient and family. Patient reported she is agreeable for SNF placement and prefers [facility]. CSW encouraged patient and family to think about additional SNF options pending availability of preferred facility. CSW will complete FL2 for MD's signature and will update patient and family when bed offers are received.   Assessment/plan status:  Psychosocial Support/Ongoing Assessment of Needs Other assessment/ plan:   Information/referral to community resources:   CSW information    PATIENT'S/FAMILY'S RESPONSE TO PLAN OF CARE: Patient states she would like to go to [facility], but also wants to go somewhere where her insurance will be accepted.        Jeanette Caprice, MSW, [place]

## 2013-09-24 NOTE — Progress Notes (Signed)
Speech Language Pathology Treatment: Dysphagia  Patient Details Name: Latasha Proctor MRN: [id number] DOB: 1917/05/24 Today's Date: 09/24/2013 Time: 1205-1227 SLP Time Calculation (min): 22 min  Assessment / Plan / Recommendation Clinical Impression  Pt. Seen for dysphagia treatment with lunch meal following MBS with daughter present.  Pt. required max verbal and tactile cues to consume smaller sips, swallow 2-3 additional times and execute chin tuck appropriately (tuck is limited due to osteophytes, however intermittently not tucking fully).  Watery eyes x 1, delayed throat clear present.  SLP reiterated strategies and clinical reasoning with daughter.  Pt. Asking insightful and appropriate questions.  SLP will continue treatment/education and recommend continue ST at SNF.   HPI HPI: 80 y.o. female with a past medical history significant for diabetes type 2, HTN, macular degeneration and atrial fibrillation admitted with Weakness, dyspnea weight loss.  CXR Patchy airspace opacities in all lobes of the lungs, with a basilar predominance. This could reflect multilobar pneumonia or a slightly atypical presentation of pulmonary.  MBS recommended following bedside swallow assessment.   Pertinent Vitals WDL  SLP Plan  Continue with current plan of care    Recommendations Diet recommendations: Dysphagia 2 (fine chop);Nectar-thick liquid Liquids provided via: Cup;No straw Medication Administration: Whole meds with puree Supervision: Patient able to self feed;Full supervision/cueing for compensatory strategies Compensations: Slow rate;Small sips/bites;Multiple dry swallows after each bite/sip;Follow solids with liquid Postural Changes and/or Swallow Maneuvers: Chin tuck;Seated upright 90 degrees;Upright 30-60 min after meal              Oral Care Recommendations: Oral care BID Follow up Recommendations: Skilled Nursing facility Plan: Continue with current plan of care    GO     Houston Siren M.Ed [employer] [phone]  09/24/2013

## 2013-09-24 NOTE — Evaluation (Signed)
Clinical/Bedside Swallow Evaluation Patient Details  Name: Latasha Proctor MRN: [id number] Date of Birth: Dec 19, 1917  Today's Date: 09/24/2013 Time: 0810-0829 SLP Time Calculation (min): 19 min  Past Medical History:  Past Medical History  Diagnosis Date  . Arthritis   . Diabetes mellitus   . Hyperlipidemia   . Hypertension   . Dysrhythmia     hx of atrial fib 2/13   . Macular degeneration   . Chronic kidney disease     stress incontinence , wears depends   Past Surgical History:  Past Surgical History  Procedure Laterality Date  . Ankle surgery    . Back    . Abdominal hysterectomy    . Melanoma excision  02/01/2012    Procedure: MELANOMA EXCISION;  Surgeon: Rolm Bookbinder, MD;  Location: [facility];  Service: General;  Laterality: Left;  wide excision left arm melanoma   HPI:  80 y.o. female with a past medical history significant for diabetes type 2, HTN, macular degeneration and atrial fibrillation admitted with Weakness, dyspnea weight loss.  CXR Patchy airspace opacities in all lobes of the lungs, with a basilar predominance. This could reflect multilobar pneumonia or a slightly atypical presentation of pulmonary edema   Assessment / Plan / Recommendation Clinical Impression  SLP suspects a primary esophageal dysphagia indicated by pt.'s description of symptoms with questionable pharyngeal component.  Audible swallow present sometimes indicative of an organic origin and SLP recommends an objective view to fully assess.  MBS recommended and scheduled at 10:00 this morning.      Aspiration Risk  Moderate    Diet Recommendation Regular;Thin liquid   Liquid Administration via: Cup Medication Administration: Whole meds with liquid Supervision: Patient able to self feed Compensations: Slow rate;Small sips/bites Postural Changes and/or Swallow Maneuvers: Seated upright 90 degrees;Upright 30-60 min after meal    Other  Recommendations Recommended Consults: MBS Oral Care  Recommendations: Oral care BID   Follow Up Recommendations   (TBD)    Frequency and Duration        Pertinent Vitals/Pain WDL         Swallow Study         Oral/Motor/Sensory Function Overall Oral Motor/Sensory Function: Appears within functional limits for tasks assessed   Ice Chips Ice chips: Not tested   Thin Liquid Thin Liquid: Impaired Presentation: Cup Pharyngeal  Phase Impairments:  (audible swallow)    Nectar Thick Nectar Thick Liquid: Not tested   Honey Thick Honey Thick Liquid: Not tested   Puree Puree: Not tested   Solid   GO    Solid: Not tested       [place] Siren M.Ed [employer] [phone]  09/24/2013

## 2013-09-24 NOTE — Progress Notes (Addendum)
Clinical Social Work Department CLINICAL SOCIAL WORK PLACEMENT NOTE 09/24/2013  Patient:  Latasha Proctor, Latasha Proctor  Account Number:  [id number] [place] date:  09/19/2013  Clinical Social Worker:  Megan Salon  Date/time:  09/24/2013 12:12 PM  Clinical Social Work is seeking post-discharge placement for this patient at the following level of care:   [place]   (*CSW will update this form in Epic as items are completed)   09/24/2013  Patient/family provided with [place] Department of Clinical Social Work's list of facilities offering this level of care within the geographic area requested by the patient (or if unable, by the patient's family).  09/24/2013  Patient/family informed of their freedom to choose among providers that offer the needed level of care, that participate in Medicare, Medicaid or managed care program needed by the patient, have an available bed and are willing to accept the patient.  09/24/2013  Patient/family informed of [facility]' ownership interest in [facility], as well as of the fact that they are under no obligation to receive care at this facility.  PASARR submitted to [employer] on  PASARR number received from [facility] on   FL2 transmitted to all facilities in geographic area requested by pt/family on  09/24/2013 FL2 transmitted to all facilities within larger geographic area on   Patient informed that his/her managed care company has contracts with or will negotiate with  certain facilities, including the following:     Patient/family informed of bed offers received:  09/24/2013 Patient chooses bed at [facility] Physician recommends and patient chooses bed at    Patient to be transferred to  on  09/25/2013 Patient to be transferred to facility by EMS  The following physician request were entered in Epic:   Additional Comments: Patient has existing Martin [place], MSW, [place]

## 2013-09-24 NOTE — Procedures (Signed)
Objective Swallowing Evaluation: Modified Barium Swallowing Study  Patient Details  Name: Latasha Proctor MRN: [id number] Date of Birth: 1917-12-21  Today's Date: 09/24/2013 Time: 1025-1100 SLP Time Calculation (min): 35 min  Past Medical History:  Past Medical History  Diagnosis Date  . Arthritis   . Diabetes mellitus   . Hyperlipidemia   . Hypertension   . Dysrhythmia     hx of atrial fib 2/13   . Macular degeneration   . Chronic kidney disease     stress incontinence , wears depends   Past Surgical History:  Past Surgical History  Procedure Laterality Date  . Ankle surgery    . Back    . Abdominal hysterectomy    . Melanoma excision  02/01/2012    Procedure: MELANOMA EXCISION;  Surgeon: Rolm Bookbinder, MD;  Location: [facility];  Service: General;  Laterality: Left;  wide excision left arm melanoma   HPI:  80 y.o. female with a past medical history significant for diabetes type 2, HTN, macular degeneration and atrial fibrillation admitted with Weakness, dyspnea weight loss.  CXR Patchy airspace opacities in all lobes of the lungs, with a basilar predominance. This could reflect multilobar pneumonia or a slightly atypical presentation of pulmonary.  MBS recommended following bedside swallow assessment.     Assessment / Plan / Recommendation Clinical Impression  Dysphagia Diagnosis: Mild oral phase dysphagia;Severe pharyngeal phase dysphagia;Moderate pharyngeal phase dysphagia (suspected esophageal dysphagia) Clinical impression: Pt. demonstrates multifactoral oropharyngeal due to organic/structural as well as suspected esophageal origins.  She appears to have osteophyte at approximately C4-5 preventing full epiglottic inversion with resultant consistent SILENT aspiration despite compensatory techniques (chin tuck).  Maximum vallecular residue and 2 spontaneous additional swallows which decreased but did not clear residue and mild pyriform sinsus residue.  In addition, pt.'s  esophagus scanned (no radiologist present and SLP does not diagnose deficts below level of the UES) revealing esophagus full of barium and slow to transit through to GE junction.  This pt. is relatively clear cognitively, lived independently prior to admission and feel she would benefit from modifying liquids (nectar) and compensatory techniques rather than comfort feeds with thin liquids at this point, given likely readmission with pna.  Her dysphagia may progress to the point of comfort feeding with known aspiration risks but suspect quality and safety may be achieved with mild modification of po consistencies and behavioral/compensatory techniques.  Reccommend Dys 2 texture and nectar thick liquids, chin tuck with all swallows, 2-3 dry swallows, stay upright 1 hour after meals, no straws, pills whole in applesauce unless large pill or pt. is unable to transit whole in applesauce, alternating liquids and solids.  She may benefit from 6 smaller, more frequent meals a day versus 3 meals.  ST will continue to follow and provide education; continue ST at SNF.         Treatment Recommendation  Therapy as outlined in treatment plan below    Diet Recommendation Dysphagia 2 (Fine chop);Nectar-thick liquid   Liquid Administration via: Cup;No straw Medication Administration: Whole meds with puree Supervision: Patient able to self feed;Full supervision/cueing for compensatory strategies Compensations: Slow rate;Small sips/bites;Multiple dry swallows after each bite/sip;Follow solids with liquid Postural Changes and/or Swallow Maneuvers: Chin tuck;Seated upright 90 degrees;Upright 30-60 min after meal    Other  Recommendations Recommended Consults: MBS Oral Care Recommendations: Oral care BID   Follow Up Recommendations  Skilled Nursing facility    Frequency and Duration min 2x/week  2 weeks   Pertinent Vitals/Pain WDL  Reason for Referral Objectively evaluate swallowing function   Oral  Phase Oral Preparation/Oral Phase Oral Phase: Impaired Oral - Solids Oral - Regular:  (rapid and inefficient mastication of solid)   Pharyngeal Phase Pharyngeal Phase Pharyngeal Phase: Impaired Pharyngeal - Nectar Pharyngeal - Nectar Teaspoon: Pharyngeal residue - valleculae;Pharyngeal residue - pyriform sinuses;Reduced airway/laryngeal closure;Reduced epiglottic inversion Pharyngeal - Nectar Cup: Pharyngeal residue - valleculae;Pharyngeal residue - pyriform sinuses;Reduced airway/laryngeal closure;Reduced epiglottic inversion Pharyngeal - Thin Pharyngeal - Thin Teaspoon: Penetration/Aspiration during swallow;Reduced epiglottic inversion;Reduced laryngeal elevation;Reduced airway/laryngeal closure;Pharyngeal residue - valleculae;Pharyngeal residue - pyriform sinuses Penetration/Aspiration details (thin teaspoon): Material enters airway, passes BELOW cords without attempt by patient to eject out (silent aspiration) Pharyngeal - Thin Cup: Penetration/Aspiration during swallow;Reduced epiglottic inversion;Reduced laryngeal elevation;Reduced airway/laryngeal closure;Pharyngeal residue - valleculae;Pharyngeal residue - pyriform sinuses (with chin tuck) Penetration/Aspiration details (thin cup): Material enters airway, passes BELOW cords without attempt by patient to eject out (silent aspiration) Pharyngeal - Solids Pharyngeal - Puree: Reduced laryngeal elevation;Pharyngeal residue - valleculae;Pharyngeal residue - pyriform sinuses Pharyngeal - Regular: Pharyngeal residue - valleculae;Reduced laryngeal elevation  Cervical Esophageal Phase    GO    Cervical Esophageal Phase Cervical Esophageal Phase: Impaired         Houston Siren M.Ed [employer] [phone]  09/24/2013

## 2013-09-24 NOTE — Progress Notes (Signed)
Occupational Therapy Treatment Patient Details Name: Latasha Proctor MRN: [id number] DOB: [birth year] Today's Date: 09/24/2013    History of present illness Pt is a 80 year old female who was admitted with c/o weakness, weight loss.  She has A Fib with RVR   OT comments  Pt completed ADLs at sink level seated this session. Pt noted to have wound on Lt foot and RN notified. Pt progressing slowly toward goals. Recommend SNF placement. Recommend RW at this time due to inability to lock 4WW safely without (A).   Follow Up Recommendations  SNF    Equipment Recommendations  None recommended by OT    Recommendations for Other Services      Precautions / Restrictions Precautions Precautions: Fall Restrictions Weight Bearing Restrictions: No Other Position/Activity Restrictions: baseline macular degeneration       Mobility Bed Mobility Overal bed mobility: Needs Assistance Bed Mobility: Sit to Supine       Sit to supine: Min guard   General bed mobility comments: required extended time to complete bed mobility. Pt asking therapist to use pad to reposition. Pt informed pad was under bed sheet. Pt states "well that doesnt make sense" and repositions mod I without help.  Transfers Overall transfer level: Needs assistance Equipment used: 4-wheeled walker Transfers: Sit to/from Stand Sit to Stand: Mod assist         General transfer comment: pt demonstrates x4 attempts to exit recliner and unable to achieve with BIL UE use    Balance Overall balance assessment: Needs assistance Sitting-balance support: Bilateral upper extremity supported;Feet supported Sitting balance-Leahy Scale: Fair     Standing balance support: Bilateral upper extremity supported;During functional activity Standing balance-Leahy Scale: Poor                     ADL Overall ADL's : Needs assistance/impaired     Grooming: Wash/dry face;Set up;Sitting (sitting on 4WW sink level) Grooming Details  (indicate cue type and reason): declined oral care awaiting daughter arrival Upper Body Bathing: Set up;Sitting   Lower Body Bathing: Moderate assistance;Sit to/from stand   Upper Body Dressing : Set up;Sitting   Lower Body Dressing: Maximal assistance;Sit to/from stand Lower Body Dressing Details (indicate cue type and reason): pt unable to don doff socks. Pt uses sock aide at home Toilet Transfer: Moderate assistance;Ambulation;BSC           Functional mobility during ADLs: Minimal assistance (9NA) General ADL Comments: pt demonstrates inability to lock 4WW brakes. Pt attempting with bil UE and unsuccessful. Pt required mod (A) to complete sit<>stand from recliner. Pt needed verbal encouragement to complete bathing at sink. Pt states "they have been bathing me in the chair." Pt encouraged to complete as much as possible without (A) and then OT would complete the rest. Pt soaking bil feet in basin. Noted wound on foot in two places (dorsal of LT foot and medial side of great toe) Pt unaware that wounds were present. Pt with lateral aspect of calf wound that patient reports was from shower transfer.       Vision                     Perception     Praxis      Cognition   Behavior During Therapy: [facility] for tasks assessed/performed Overall Cognitive Status: Within Functional Limits for tasks assessed  Extremity/Trunk Assessment               Exercises     Shoulder Instructions       General Comments      Pertinent Vitals/ Pain       None reported  Home Living                                          Prior Functioning/Environment              Frequency Min 2X/week     Progress Toward Goals  OT Goals(current goals can now be found in the care plan section)  Progress towards OT goals: Progressing toward goals  Acute Rehab OT Goals Patient Stated Goal: To go to rehab to get stronger. OT Goal  Formulation: With patient Time For Goal Achievement: 10/05/13 Potential to Achieve Goals: Fair ADL Goals Pt Will Perform Grooming: with supervision;standing Pt Will Perform Lower Body Bathing: with supervision;with adaptive equipment;sit to/from stand Pt Will Perform Lower Body Dressing: with supervision;with adaptive equipment;sit to/from stand Pt Will Transfer to Toilet: with supervision;ambulating;bedside commode Pt Will Perform Toileting - Clothing Manipulation and hygiene: with supervision;sit to/from stand  Plan Discharge plan remains appropriate    Co-evaluation                 End of Session     Activity Tolerance Patient tolerated treatment well   Patient Left in bed;with call bell/phone within reach   Nurse Communication Mobility status;Precautions        Time: 2876-8115 OT Time Calculation (min): 23 min  Charges: OT General Charges $OT Visit: 1 Procedure OT Treatments $Self Care/Home Management : 23-37 mins  Peri Maris 09/24/2013, 9:48 AM Pager: [phone]

## 2013-09-24 NOTE — Progress Notes (Signed)
Subjective: No new complaints.   Getting Speech Eval and Swallowing eval Shortness of breath is stable.   Swelling in legs improved.   HR 110-120 on monitor No C/O  HOH  Objective: Vital signs in last 24 hours: Temp:  [98.4 F (36.9 C)-98.6 F (37 C)] 98.4 F (36.9 C) (05/25 0453) Pulse Rate:  [94-109] 109 (05/25 0816) Resp:  [16-18] 18 (05/25 0453) BP: (105-149)/(46-68) 131/66 mmHg (05/25 0816) SpO2:  [96 %-97 %] 96 % (05/25 0453) Weight:  [53.3 kg (117 lb 8.1 oz)] 53.3 kg (117 lb 8.1 oz) (05/25 0453) Weight change: 2 kg (4 lb 6.5 oz) Last BM Date: 09/18/13  CBG (last 3)   Recent Labs  09/23/13 1618 09/23/13 2104 09/24/13 0615  GLUCAP 163* 227* 171*    Intake/Output from previous day: 05/24 0701 - 05/25 0700 In: 540 [P.O.:540] Out: 100 [Urine:100] Intake/Output this shift:    General appearance: alert and no distress Eyes: no scleral icterus Throat: oropharynx moist without erythema Resp: clear to auscultation bilaterally and minimal left basilar crackles Cardio: irregularly irregular rhythm GI: soft, non-tender; bowel sounds normal; no masses,  no organomegaly Extremities: no clubbing, cyanosis; trace edema   Lab Results:  Recent Labs  09/23/13 0455 09/24/13 0406  NA 138 138  K 4.1 5.3  CL 97 99  CO2 32 33*  GLUCOSE 141* 191*  BUN 30* 33*  CREATININE 1.26* 1.24*  CALCIUM 8.9 8.8   No results found for this basename: AST, ALT, ALKPHOS, BILITOT, PROT, ALBUMIN,  in the last 72 hours  Recent Labs  09/23/13 0455 09/24/13 0406  WBC 7.1 5.1  NEUTROABS 6.2 4.1  HGB 9.8* 9.9*  HCT 31.8* 30.4*  MCV 85.5 84.0  PLT 132* 134*   Lab Results  Component Value Date   INR 1.51* 05/27/2010   No results found for this basename: CKTOTAL, CKMB, CKMBINDEX, TROPONINI,  in the last 72 hours No results found for this basename: TSH, T4TOTAL, FREET3, T3FREE, THYROIDAB,  in the last 72 hours No results found for this basename: VITAMINB12, FOLATE, FERRITIN, TIBC,  IRON, RETICCTPCT,  in the last 72 hours  Studies/Results: No results found.   Medications: Scheduled: . apixaban  2.5 mg Oral BID  . diltiazem  240 mg Oral Daily  . feeding supplement (ENSURE COMPLETE)  237 mL Oral Q1500  . furosemide  20 mg Oral Daily  . insulin aspart  0-5 Units Subcutaneous QHS  . insulin aspart  0-9 Units Subcutaneous TID WC  . insulin aspart  3 Units Subcutaneous TID WC  . levofloxacin  500 mg Oral Q48H   Continuous:   Assessment/Plan:  1. Atrial fibrillation with RVR - (LV EF: 55% - 60%).  transitioned to oral Cardizem and Eliquis. Possible DCCV as outpatient per Cardiology.  Cardizem was increased to 240mg  daily yesterday due to increased HR. I will add back a little bit of BB as I do not see any contra-indication   2. Chest pain, atypical- negative cardiolite. No recurrence. Low suspicion for PE and will be on anticoagulation regardless.  3. Severe Protein Calorie Malnutrition/Weight loss, unintentional- TSH normal. SPEP pending. CT Chest/Abd/Pelvis negative for malignancy. Very low prealbumen. Ensure added.  4. Left Pleural Effusions- Small-moderate bilateral effusion- appear simple on CT. May be related to low albumen state. 5. Anemia/Thrombocytopenia- stable. Low reticulocyte count but otherwise normal anemia panel. No B symptoms other than weight loss, fatigue.  6. Possible Atypical Pneumonia- Chest CT suggests possible atypical pneumonia (raises possibility of aspiration vs.  MAI).  Remains on Levaquin though minimal infectious symptoms other than cough and sputum production.   7. Dysphagia - Swallow evaluation due to dysphagia. See ST notes. D2 and Nectar thickened liq.  Chin tuck.   Will need education at [facility] 8. Disposition- Anticipate discharge to SNF Rehab on Tuesday. Prefers [facility]. FL-2 signed 9. DM2 - ISS and CBGs 144-227. Lantus 5 HS added. 10. Pulmonary arteries: PA peak pressure: 37 mm Hg (S).      LOS: 5 days   Precious Reel 09/24/2013, 10:38 AM

## 2013-09-25 LAB — CBC WITH DIFFERENTIAL/PLATELET
Basophils Absolute: 0 10*3/uL (ref 0.0–0.1)
Basophils Relative: 0 % (ref 0–1)
EOS ABS: 0.1 10*3/uL (ref 0.0–0.7)
Eosinophils Relative: 1 % (ref 0–5)
HCT: 31.2 % — ABNORMAL LOW (ref 36.0–46.0)
Hemoglobin: 9.9 g/dL — ABNORMAL LOW (ref 12.0–15.0)
LYMPHS ABS: 0.6 10*3/uL — AB (ref 0.7–4.0)
LYMPHS PCT: 10 % — AB (ref 12–46)
MCH: 26.8 pg (ref 26.0–34.0)
MCHC: 31.7 g/dL (ref 30.0–36.0)
MCV: 84.3 fL (ref 78.0–100.0)
Monocytes Absolute: 0.6 10*3/uL (ref 0.1–1.0)
Monocytes Relative: 9 % (ref 3–12)
NEUTROS PCT: 80 % — AB (ref 43–77)
Neutro Abs: 5 10*3/uL (ref 1.7–7.7)
PLATELETS: 158 10*3/uL (ref 150–400)
RBC: 3.7 MIL/uL — AB (ref 3.87–5.11)
RDW: 15.5 % (ref 11.5–15.5)
WBC: 6.2 10*3/uL (ref 4.0–10.5)

## 2013-09-25 LAB — PROTEIN ELECTROPHORESIS, SERUM
ALPHA-1-GLOBULIN: 6.2 % — AB (ref 2.9–4.9)
Albumin ELP: 57.7 % (ref 55.8–66.1)
Alpha-2-Globulin: 15.1 % — ABNORMAL HIGH (ref 7.1–11.8)
Beta 2: 3.9 % (ref 3.2–6.5)
Beta Globulin: 5.7 % (ref 4.7–7.2)
GAMMA GLOBULIN: 11.4 % (ref 11.1–18.8)
M-Spike, %: NOT DETECTED g/dL
Total Protein ELP: 4.9 g/dL — ABNORMAL LOW (ref 6.0–8.3)

## 2013-09-25 LAB — BASIC METABOLIC PANEL
BUN: 28 mg/dL — ABNORMAL HIGH (ref 6–23)
CO2: 30 meq/L (ref 19–32)
Calcium: 9.3 mg/dL (ref 8.4–10.5)
Chloride: 99 mEq/L (ref 96–112)
Creatinine, Ser: 1.15 mg/dL — ABNORMAL HIGH (ref 0.50–1.10)
GFR calc Af Amer: 46 mL/min — ABNORMAL LOW (ref >90)
GFR, EST NON AFRICAN AMERICAN: 40 mL/min — AB (ref >90)
Glucose, Bld: 114 mg/dL — ABNORMAL HIGH (ref 70–99)
Potassium: 4.8 mEq/L (ref 3.7–5.3)
SODIUM: 139 meq/L (ref 137–147)

## 2013-09-25 LAB — GLUCOSE, CAPILLARY
GLUCOSE-CAPILLARY: 199 mg/dL — AB (ref 70–99)
GLUCOSE-CAPILLARY: 118 mg/dL — AB (ref 70–99)
Glucose-Capillary: 198 mg/dL — ABNORMAL HIGH (ref 70–99)

## 2013-09-25 MED ORDER — INSULIN ASPART 100 UNIT/ML ~~LOC~~ SOLN: 0.0000 [IU] | Freq: Every day | SUBCUTANEOUS | Status: DC

## 2013-09-25 MED ORDER — INSULIN ASPART 100 UNIT/ML ~~LOC~~ SOLN: 0.0000 [IU] | Freq: Three times a day (TID) | SUBCUTANEOUS | Status: DC

## 2013-09-25 MED ORDER — LEVOFLOXACIN 500 MG PO TABS: 500.0000 mg | ORAL_TABLET | ORAL | Status: DC

## 2013-09-25 MED ORDER — FUROSEMIDE 20 MG PO TABS: 20.0000 mg | ORAL_TABLET | Freq: Every day | ORAL | Status: DC

## 2013-09-25 MED ORDER — ENSURE COMPLETE PO LIQD: 237.0000 mL | Freq: Every day | ORAL | Status: DC

## 2013-09-25 MED ORDER — METOPROLOL TARTRATE 12.5 MG HALF TABLET: 12.5000 mg | ORAL_TABLET | Freq: Two times a day (BID) | ORAL | Status: DC

## 2013-09-25 MED ORDER — BISACODYL 10 MG RE SUPP
10.0000 mg | Freq: Once | RECTAL | Status: AC
  Administered 2013-09-25: 10 mg via RECTAL
  Filled 2013-09-25: qty 1

## 2013-09-25 MED ORDER — STARCH (THICKENING) PO POWD: 1.0000 g | ORAL | Status: DC | PRN

## 2013-09-25 MED ORDER — APIXABAN 2.5 MG PO TABS
2.5000 mg | ORAL_TABLET | Freq: Two times a day (BID) | ORAL | Status: DC
Start: 2013-09-25 — End: 2013-11-09

## 2013-09-25 MED ORDER — DILTIAZEM HCL ER COATED BEADS 240 MG PO CP24: 240.0000 mg | ORAL_CAPSULE | Freq: Every day | ORAL | Status: DC

## 2013-09-25 MED ORDER — INSULIN GLARGINE 100 UNIT/ML ~~LOC~~ SOLN: 5.0000 [IU] | Freq: Every day | SUBCUTANEOUS | Status: DC

## 2013-09-25 MED ORDER — INSULIN ASPART 100 UNIT/ML ~~LOC~~ SOLN: 3.0000 [IU] | Freq: Three times a day (TID) | SUBCUTANEOUS | Status: DC

## 2013-09-25 NOTE — Progress Notes (Signed)
PT Cancellation Note  Patient Details Name: Latasha Proctor MRN: [id number] DOB: [birth year]   Cancelled Treatment:    Reason Eval/Treat Not Completed: Other (comment) Pt reports d/c to [facility] today and waiting for her daughter to bring clothes.  Pt wishes to conserve her energy for d/c and transfer to SNF, declines therapy.    Junius Argyle 09/25/2013, 10:54 AM Carmelia Bake, PT, DPT 09/25/2013 Pager: [phone]

## 2013-09-25 NOTE — Discharge Summary (Signed)
DISCHARGE SUMMARY  Latasha Proctor  MR#: [id number]  DOB:05-Jan-1918  Date of Admission: 09/19/2013 Date of Discharge: 09/25/2013  Attending [place]  Patient's EXH:BZJIR,CVELFYB [name], MD  Consults:  cardiology  Discharge Diagnoses: Principal Problem:   Atrial fibrillation with RVR Active Problems:   Chest pain   Weight loss, unintentional   Bilateral pleural effusion   Protein-calorie malnutrition, severe Type 2 diabetes with diabetic nephropathy Baseline creatinine around 1.2 Dysphagia, now with a dys 2 diet with thickened liquids Possible aspiration pneumonia with antibiotics ending May 31 Unstable gait Macular degeneration with significant vision loss  hypertension Anemia Diastolic congestive heart failure Pulmonary artery hypertension Hepatic cysts Osteopenia DO NOT RESUSCITATE status  Discharge Medications:   Medication List    STOP taking these medications       amLODipine 5 MG tablet  Commonly known as:  NORVASC     aspirin EC 81 MG tablet     benazepril 20 MG tablet  Commonly known as:  LOTENSIN     metFORMIN 1000 MG tablet  Commonly known as:  GLUCOPHAGE     ONGLYZA 5 MG Tabs tablet  Generic drug:  saxagliptin HCl     valsartan-hydrochlorothiazide 320-12.5 MG per tablet  Commonly known as:  DIOVAN-HCT      TAKE these medications       apixaban 2.5 MG Tabs tablet  Commonly known as:  ELIQUIS  Take 1 tablet (2.5 mg total) by mouth 2 (two) times daily.     diltiazem 240 MG 24 hr capsule  Commonly known as:  CARDIZEM CD  Take 1 capsule (240 mg total) by mouth daily.     feeding supplement (ENSURE COMPLETE) Liqd  Take 237 mLs by mouth daily at 3 pm.     food thickener Powd  Commonly known as:  THICK IT  Take 1 g by mouth as needed (dysphagia).     furosemide 20 MG tablet  Commonly known as:  LASIX  Take 1 tablet (20 mg total) by mouth daily.     insulin aspart 100 UNIT/ML injection  Commonly known as:  novoLOG   Inject 0-5 Units into the skin at bedtime.     insulin aspart 100 UNIT/ML injection  Commonly known as:  novoLOG  Inject 0-9 Units into the skin 3 (three) times daily with meals.     insulin aspart 100 UNIT/ML injection  Commonly known as:  novoLOG  Inject 3 Units into the skin 3 (three) times daily with meals.     insulin glargine 100 UNIT/ML injection  Commonly known as:  LANTUS  Inject 0.05 mLs (5 Units total) into the skin at bedtime.     levofloxacin 500 MG tablet  Commonly known as:  LEVAQUIN  Take 1 tablet (500 mg total) by mouth every other day.     metoprolol tartrate 12.5 mg Tabs tablet  Commonly known as:  LOPRESSOR  Take 0.5 tablets (12.5 mg total) by mouth 2 (two) times daily.     MULTIVITAMIN PO  Take 1 tablet by mouth daily.       Nuiron 150 once daily for anemia  Hospital Procedures: Dg Chest 2 View  09/21/2013   CLINICAL DATA:  Difficulty breathing; pleural effusion  EXAM: CHEST  2 VIEW  COMPARISON:  January 31, 2012  FINDINGS: There is underlying emphysematous change. There is patchy infiltrate left base with small left effusion. Elsewhere lungs are clear. Heart is upper normal in size with normal pulmonary vascularity. No adenopathy. There is  screw and plate fixation in the mid thoracic region. There is degenerative change in the thoracic spine. There are skin folds on each side but no apparent pneumothorax.  IMPRESSION: Small left effusion with patchy left base infiltrate. Underlying emphysematous change.   Electronically Signed   By: Lowella Grip M.D.   On: 09/21/2013 13:43   Ct Chest W Contrast  09/22/2013   CLINICAL DATA:  Weight loss. Abnormal chest radiograph small left pleural effusion visualized.  EXAM: CT CHEST WITH CONTRAST  TECHNIQUE: Multidetector CT imaging of the chest was performed during intravenous contrast administration.  CONTRAST:  74mL OMNIPAQUE IOHEXOL 300 MG/ML  SOLN  COMPARISON:  Chest radiograph 09/21/2013. CT abdomen pelvis  09/20/1998  FINDINGS: Heart is moderately enlarged. Both atria and the left ventricle are dilated. Left ventricular wall thickening noted. There is calcification of the mitral annulus. Faint calcification noted in the region of the aortic valve.  Thoracic aorta is normal in caliber and contains moderate atherosclerotic calcification. There is some calcification of the left coronary artery.  There are bilateral moderately-sized symmetric and simple appearing bilateral pleural effusions. Very small pericardial effusion seen inferiorly.  Negative for supraclavicular axillary, or hilar lymphadenopathy. There is a mildly prominent 13 mm precarinal mediastinal lymph node. Esophagus is unremarkable.  Lung discuss that lung windows demonstrate bilateral scattered patchy airspace opacities. These are most prominent in the lower lobes near the lung bases, the right middle lobe, and lingula.  There is a single discrete 5 mm oval right lower lobe pulmonary nodule on image number 31 of series 3. No pulmonary mass is seen.  Postsurgical changes of posterior spinal fusion spanning T6 through T10. Vertebral bodies are normal in height and alignment. No suspicious bony abnormality.  Upper abdomen: There are multiple circumscribed water density lesions in the liver consistent with hepatic cysts, unchanged on recent CT abdomen 09/19/2013. Slight thickening of both adrenal glands is symmetric and stable. No discrete adrenal mass.  IMPRESSION: 1. Bilateral moderate-sized pleural effusions.  These appear simple. 2. Patchy airspace opacities in all lobes of the lungs, with a basilar predominance. This could reflect multilobar pneumonia or a slightly atypical presentation of pulmonary edema. 3. 5 mm discrete pulmonary nodule right lower lobe. If the patient is at high risk for bronchogenic carcinoma, follow-up chest CT at 6-12 months is recommended. If the patient is at low risk for bronchogenic carcinoma, follow-up chest CT at 12 months  is recommended. This recommendation follows the consensus statement: Guidelines for Management of Small Pulmonary Nodules Detected on CT Scans: A Statement from the [place] as published in Radiology 2005;237:395-400. 4. Single mildly prominent mediastinal lymph node, likely reactive. 5. Cardiomegaly with small pericardial effusion. 6. Coronary artery atherosclerotic calcification. 7. Hepatic cysts.   Electronically Signed   By: Curlene Dolphin M.D.   On: 09/22/2013 12:56   Ct Abdomen Pelvis W Contrast  09/19/2013   CLINICAL DATA:  Weight loss, history diabetes, hypertension, chronic kidney disease  EXAM: CT ABDOMEN AND PELVIS WITH CONTRAST  TECHNIQUE: Multidetector CT imaging of the abdomen and pelvis was performed using the standard protocol following bolus administration of intravenous contrast. Sagittal and coronal MPR images reconstructed from axial data set.  CONTRAST:  51mL OMNIPAQUE IOHEXOL 300 MG/ML SOLN IV. Dilute oral contrast was not administered  COMPARISON:  None  FINDINGS: BILATERAL pleural effusions LEFT greater on the RIGHT with minimal bibasilar atelectasis.  Beam hardening artifacts from orthopedic hardware at inferior thoracic spine.  Numerous hepatic cysts largest lateral segment LEFT  lobe 4.1 x 3.0 cm image 18.  Remainder of liver, spleen, pancreas, and adrenal glands normal appearance.  Renal cortical thinning bilaterally with patchy areas of cortical and medullary enhancement on delayed images, nonspecific but could represent infection.  Scattered atherosclerotic calcifications.  Stomach decompressed.  Large and small bowel loops normal appearance.  Normal appearing bladder and ureters.  Uterus surgically absent with normal sized ovaries noted.  No mass, adenopathy, free fluid or inflammatory process.  Scattered pelvic phleboliths.  Bones diffusely demineralized with degenerative changes at both hip joints.  Multilevel degenerative disc and facet disease changes thoracolumbar  spine.  IMPRESSION: Hepatic cystic disease.  Patchy poorly defined areas of renal enhancement on delayed images, nonspecific, could potentially represent infection ; recommend correlation with urinalysis.  BILATERAL pleural effusions and minimal bibasilar atelectasis, greater on LEFT.   Electronically Signed   By: Lavonia Dana M.D.   On: 09/19/2013 18:10   Nm Myocar Multi W/spect W/wall Motion / Ef  09/21/2013   CLINICAL DATA:  Chest pain.  EXAM: MYOCARDIAL IMAGING WITH SPECT (REST AND PHARMACOLOGIC-STRESS)  GATED LEFT VENTRICULAR WALL MOTION STUDY  LEFT VENTRICULAR EJECTION FRACTION  TECHNIQUE: Standard myocardial SPECT imaging was performed after resting intravenous injection of 10 mCi Tc-66m sestamibi. Subsequently, intravenous infusion of Lexiscan was performed under the supervision of the Cardiology staff. At peak effect of the drug, 30 mCi Tc-36m sestamibi was injected intravenously and standard myocardial SPECT imaging was performed. Quantitative gated imaging was also performed to evaluate left ventricular wall motion, and estimate left ventricular ejection fraction.  COMPARISON:  None.  FINDINGS: The SPECT stress and rest images demonstrate no fixed or reversible defects to suggest infarction or ischemia. The gated SPECT images demonstrate normal wall motion and myocardial thickening with contraction. The ejection fraction was calculated at 53%.  IMPRESSION: No findings for infarction or ischemia.  Normal wall motion and myocardial thickening with contraction.  Ejection fraction calculated at 53%   Electronically Signed   By: Kalman Jewels M.D.   On: 09/21/2013 15:35   Dg Swallowing Func-speech Pathology  09/24/2013   Orbie Pyo [place], CCC-SLP     09/24/2013 11:50 AM Objective Swallowing Evaluation: Modified Barium Swallowing Study   Patient Details  Name: Latasha Proctor MRN: [id number] Date of Birth: 07-20-1917  Today's Date: 09/24/2013 Time: 1025-1100 SLP Time Calculation (min): 35 min  Past Medical  History:  Past Medical History  Diagnosis Date  . Arthritis   . Diabetes mellitus   . Hyperlipidemia   . Hypertension   . Dysrhythmia     hx of atrial fib 2/13   . Macular degeneration   . Chronic kidney disease     stress incontinence , wears depends   Past Surgical History:  Past Surgical History  Procedure Laterality Date  . Ankle surgery    . Back    . Abdominal hysterectomy    . Melanoma excision  02/01/2012    Procedure: MELANOMA EXCISION;  Surgeon: Rolm Bookbinder, MD;   Location: [facility];  Service: General;  Laterality: Left;  wide  excision left arm melanoma   HPI:  80 y.o. female with a past medical history significant for  diabetes type 2, HTN, macular degeneration and atrial  fibrillation admitted with Weakness, dyspnea weight loss.  CXR  Patchy airspace opacities in all lobes of the lungs, with a  basilar predominance. This could reflect multilobar pneumonia or  a slightly atypical presentation of pulmonary.  MBS recommended  following bedside swallow assessment.  Assessment / Plan / Recommendation Clinical Impression  Dysphagia Diagnosis: Mild oral phase dysphagia;Severe pharyngeal  phase dysphagia;Moderate pharyngeal phase dysphagia (suspected  esophageal dysphagia) Clinical impression: Pt. demonstrates multifactoral oropharyngeal  due to organic/structural as well as suspected esophageal  origins.  She appears to have osteophyte at approximately C4-5  preventing full epiglottic inversion with resultant consistent  SILENT aspiration despite compensatory techniques (chin tuck).   Maximum vallecular residue and 2 spontaneous additional swallows  which decreased but did not clear residue and mild pyriform  sinsus residue.  In addition, pt.'s esophagus scanned (no  radiologist present and SLP does not diagnose deficts below level  of the UES) revealing esophagus full of barium and slow to  transit through to GE junction.  This pt. is relatively clear  cognitively, lived independently prior to admission  and feel she  would benefit from modifying liquids (nectar) and compensatory  techniques rather than comfort feeds with thin liquids at this  point, given likely readmission with pna.  Her dysphagia may  progress to the point of comfort feeding with known aspiration  risks but suspect quality and safety may be achieved with mild  modification of po consistencies and behavioral/compensatory  techniques.  Reccommend Dys 2 texture and nectar thick liquids,  chin tuck with all swallows, 2-3 dry swallows, stay upright 1  hour after meals, no straws, pills whole in applesauce unless  large pill or pt. is unable to transit whole in applesauce,  alternating liquids and solids.  She may benefit from 6 smaller,  more frequent meals a day versus 3 meals.  ST will continue to  follow and provide education; continue ST at SNF.         Treatment Recommendation  Therapy as outlined in treatment plan below    Diet Recommendation Dysphagia 2 (Fine chop);Nectar-thick liquid   Liquid Administration via: Cup;No straw Medication Administration: Whole meds with puree Supervision: Patient able to self feed;Full supervision/cueing  for compensatory strategies Compensations: Slow rate;Small sips/bites;Multiple dry swallows  after each bite/sip;Follow solids with liquid Postural Changes and/or Swallow Maneuvers: Chin tuck;Seated  upright 90 degrees;Upright 30-60 min after meal    Other  Recommendations Recommended Consults: MBS Oral Care Recommendations: Oral care BID   Follow Up Recommendations  Skilled Nursing facility    Frequency and Duration min 2x/week  2 weeks   Pertinent Vitals/Pain WDL            Reason for Referral Objectively evaluate swallowing function   Oral Phase Oral Preparation/Oral Phase Oral Phase: Impaired Oral - Solids Oral - Regular:  (rapid and inefficient mastication of solid)   Pharyngeal Phase Pharyngeal Phase Pharyngeal Phase: Impaired Pharyngeal - Nectar Pharyngeal - Nectar Teaspoon: Pharyngeal residue -   valleculae;Pharyngeal residue - pyriform sinuses;Reduced  airway/laryngeal closure;Reduced epiglottic inversion Pharyngeal - Nectar Cup: Pharyngeal residue -  valleculae;Pharyngeal residue - pyriform sinuses;Reduced  airway/laryngeal closure;Reduced epiglottic inversion Pharyngeal - Thin Pharyngeal - Thin Teaspoon: Penetration/Aspiration during  swallow;Reduced epiglottic inversion;Reduced laryngeal  elevation;Reduced airway/laryngeal closure;Pharyngeal residue -  valleculae;Pharyngeal residue - pyriform sinuses Penetration/Aspiration details (thin teaspoon): Material enters  airway, passes BELOW cords without attempt by patient to eject  out (silent aspiration) Pharyngeal - Thin Cup: Penetration/Aspiration during  swallow;Reduced epiglottic inversion;Reduced laryngeal  elevation;Reduced airway/laryngeal closure;Pharyngeal residue -  valleculae;Pharyngeal residue - pyriform sinuses (with chin tuck) Penetration/Aspiration details (thin cup): Material enters  airway, passes BELOW cords without attempt by patient to eject  out (silent aspiration) Pharyngeal - Solids Pharyngeal - Puree: Reduced laryngeal elevation;Pharyngeal  residue - valleculae;Pharyngeal residue - pyriform sinuses Pharyngeal - Regular: Pharyngeal residue - valleculae;Reduced  laryngeal elevation  Cervical Esophageal Phase    GO    Cervical Esophageal Phase Cervical Esophageal Phase: Impaired         Houston Siren M.Ed CCC-SLP Pager [phone]  09/24/2013    History of Present Illness: Shortness of breath  Hospital Course: This is a 80 year old white female long-standing patient of practice. She presented with dyspnea and weight loss. She was found to have significant atrial fibrillation with rapid ventricular response. Also in the last few months she's lost a total of 50 pounds. She notes some swallowing difficulties but had no chest pain. She brought in the hospital initially treated with Cardizem intravenously and then transitioned to  oral. She still had atrial fibrillation but her rate is much improved. She's had extensive evaluation with no evidence of ischemic heart disease are diminished ejection fraction. She did have some fluid overload with diastolic dysfunction that is improved. Plans are for a DC cardioversion at some point in the future. Her cardiac status is much improved at the present. She has no orthopnea or PND. Her peripheral edema has resolved. She's not having shortness of breath or chest pain at present time. With regard her weight loss and extensive volume way she was undertaken. Thyroid testing cortisol testing was normal. She was seen by speech therapy and found to have some dysphagia which may be related to this loss. CT scans of the chest abdomen and pelvis did not show any malignancy. There may be some aspiration pneumonia there may be a chronic issue and she's on antibiotics for this.  She does have some iron deficiency anemia. Were not going to do an extensive evaluation of this but we'll just give her iron supplements. Her diabetes is done fairly well. She's currently on insulin which I think may help her put back some of her weight. Her neurologic status is been stable and she maintains partly well. She can ambulate with a walker with some help. She has no pain or the difficulties today. Her blood pressure regimen has been changed. She is doing well at present time. She does have small bilateral pleural effusions which are likely related to low albumin state. Going to observe this at the present time. She's going out to [place] to be under my care for rehabilitation. Probable return to home his long-term plan  Day of Discharge Exam BP 134/51  Pulse 79  Temp(Src) 97.6 F (36.4 C) (Oral)  Resp 18  Ht 5\' 3"  (1.6 m)  Wt 51.393 kg (113 lb 4.8 oz)  BMI 20.08 kg/m2  SpO2 98%  Physical Exam: General appearance: Thin white female in no distress. Sclera anicteric. No nystagmus is present. Oral mucous members  are moist.  Resp: Clear with no wheezes rales or rhonchi. No accessory muscles are in use.  Cardio: Irregularly irregular with systolic murmur. GI: soft, non-tender; bowel sounds normal; no masses,  no organomegaly Extremities: No significant peripheral edema. Pulses are intact. Significant bruising of the upper arm is noted. Neuro: She is awake and alert and mentating well speech is clear. No tremor is present. Her gait was relatively stable with the walker she is in good spirits  Discharge Labs:  Recent Labs  09/24/13 0406 09/25/13 0425  NA 138 139  K 5.3 4.8  CL 99 99  CO2 33* 30  GLUCOSE 191* 114*  BUN 33* 28*  CREATININE 1.24* 1.15*  CALCIUM 8.8 9.3  Recent Labs  09/24/13 0406 09/25/13 0425  WBC 5.1 6.2  NEUTROABS 4.1 5.0  HGB 9.9* 9.9*  HCT 30.4* 31.2*  MCV 84.0 84.3  PLT 134* 158    Results for SHAKA, ZECH (MRN [id number]) as of 09/25/2013 08:24  Ref. Range 09/21/2013 09:05 09/22/2013 02:58  Pro B Natriuretic peptide (BNP) Latest Range: 0-450 pg/mL 3091.0 (H) 3909.0 (H)  Iron Latest Range: 42-135 ug/dL 31 (L)   UIBC Latest Range: 125-400 ug/dL 261   TIBC Latest Range: 250-470 ug/dL 292   Saturation Ratios Latest Range: 20-55 % 11 (L)   Ferritin Latest Range: 10-291 ng/mL 24   Folate No range found 13.8   Vitamin B-12 Latest Range: 211-911 pg/mL 413    LV EF: 55% - 60%  ------------------------------------------------------------------- Indications: Atrial fibrillation - 427.31.  ------------------------------------------------------------------- History: Risk factors: Former tobacco use.  ------------------------------------------------------------------- Study Conclusions  - Left ventricle: The cavity size was normal. Wall thickness was normal. Systolic function was normal. The estimated ejection fraction was in the range of 55% to 60%. Wall motion was normal; there were no regional wall motion abnormalities. - Aortic valve: There was moderate  stenosis. Valve area (VTI): 1.14 cm^2. Valve area (Vmax): 1.19 cm^2. - Mitral valve: Mildly calcified, moderately thickened annulus. The findings are consistent with mild stenosis. Valve area by continuity equation (using LVOT flow): 1.4 cm^2. - Left atrium: The atrium was moderately dilated. - Right atrium: The atrium was moderately dilated. - Pulmonary arteries: PA peak pressure: 37 mm Hg (S). - Pericardium, extracardiac: A trivial pericardial effusion was identified.   Myoview:FINDINGS:  The SPECT stress and rest images demonstrate no fixed or reversible  defects to suggest infarction or ischemia. The gated SPECT images  demonstrate normal wall motion and myocardial thickening with  contraction. The ejection fraction was calculated at 53%.  Discharge instructions: Dysphagia 2 diet with thickened liquids to a honey consistency Blood sugars to be checked before meals and bedtime. Sliding scale as above   Disposition: Skilled care at [facility]  Follow-up Appts: Follow-up with Dr. Forde Dandy at [facility] [place] [facility] for appointment.  Condition on Discharge: Improved  Tests Needing Follow-up: SPE[facility]  Signed: [place] 09/25/2013, 8:21 AM

## 2013-09-25 NOTE — Progress Notes (Signed)
Clinical Social Worker facilitated patient discharge including contacting patient, family and facility to confirm patient discharge plans.  Clinical information faxed to facility and family agreeable with plan.  Patient's daughter plans to take patient to [facility]..  RN to call report prior to discharge.  Clinical Social Worker will sign off for now as social work intervention is no longer needed. Please consult [place] again if new need arises.  Jeanette Caprice, MSW, [place]

## 2013-09-25 NOTE — Progress Notes (Signed)
CSW provided bed offers to patient's daughter over the phone. Daughter states she would like to go to [facility] at discharge. CSW continues to follow.  Jeanette Caprice, MSW, [place]

## 2013-10-09 ENCOUNTER — Ambulatory Visit (INDEPENDENT_AMBULATORY_CARE_PROVIDER_SITE_OTHER): Payer: Medicare Other | Admitting: Cardiology

## 2013-10-09 ENCOUNTER — Encounter: Payer: Self-pay | Admitting: Cardiology

## 2013-10-09 VITALS — BP 110/60 | HR 93 | Ht 63.0 in | Wt 136.6 lb

## 2013-10-09 DIAGNOSIS — I4891 Unspecified atrial fibrillation: Secondary | ICD-10-CM

## 2013-10-09 DIAGNOSIS — I5032 Chronic diastolic (congestive) heart failure: Secondary | ICD-10-CM

## 2013-10-09 DIAGNOSIS — I509 Heart failure, unspecified: Secondary | ICD-10-CM

## 2013-10-09 DIAGNOSIS — E878 Other disorders of electrolyte and fluid balance, not elsewhere classified: Secondary | ICD-10-CM

## 2013-10-09 DIAGNOSIS — I480 Paroxysmal atrial fibrillation: Secondary | ICD-10-CM

## 2013-10-09 NOTE — Progress Notes (Signed)
Patient ID: Latasha Proctor, female   DOB: Aug 21, 1917, 80 y.o.   MRN: [id number]    10/09/2013 Latasha Proctor   Sep 15, 1917  [id number]  Primary Physicia [name], MD Primary Cardiologist: Dr. Ellyn Hack  HPI:  This is a 80 y.o. female with a past medical history significant for diabetes type 2, HTN, macular degeneration and atrial fibrillation.  In the past, she has not been considered a good candidate for chronic oral anticoagulation given a history of repeated falls. It was felt that her risk of being on oral anticoagulation outweighed potential benefit.  She presented to [facility] on 09/19/2013 with complaints of dyspnea and fatigue. She was found to be in atrial fibrillation with RVR. She was treated initially with IV Cardizem and placed on IV heparin. TSH was normal as were cardiac enzymes. BMP was elevated at 3480, consistent with CHF. Chest x-ray demonstrated mild vascular congestion. She was treated with diuretics. A 2-D echocardiogram revealed normal systolic function with an EF of 55-60%. Visualization of her aortic valve revealed there was moderate stenosis with a valve area of 1.14 cm. She underwent a nuclear stress test which was negative for ischemia.  She ultimately had improvement in her heart rate but continued in atrial fibrillation. It was felt she would benefit from direct current cardioversion to restore normal sinus rhythm. Thus, it was decided to transition her to a NOAC, reevaluate in 4 weeks and plan for cardioversion if still in atrial fibrillation. Once stabilized, she was discharged to a skilled nursing facility.  She presents to clinic today for post hospital followup. She states that she has been doing fairly well. She denies any awareness of her arrhythmia. She denies palpitations, chest discomfort, shortness of breath, dizziness, syncope/near-syncope. She feels that she is slowly regaining strength with rehabilitation. She denies any falls. She states that she  has been monitored closely at the skilled nursing facility. She denies any signs of abnormal bleeding. She states that the nurses at the nursing facility have been administering her medications.  Her EKG in clinic today demonstrates atrial fibrillation. Heart rate 93 beats per minute.    Current Outpatient Prescriptions  Medication Sig Dispense Refill  . apixaban (ELIQUIS) 2.5 MG TABS tablet Take 1 tablet (2.5 mg total) by mouth 2 (two) times daily.  1 tablet  0  . diltiazem (CARDIZEM CD) 240 MG 24 hr capsule Take 1 capsule (240 mg total) by mouth daily.  1 capsule  0  . furosemide (LASIX) 20 MG tablet Take 40 mg by mouth daily.      . insulin aspart (NOVOLOG) 100 UNIT/ML injection Inject 3 Units into the skin 3 (three) times daily with meals.  10 mL  11  . insulin glargine (LANTUS) 100 UNIT/ML injection Inject 0.05 mLs (5 Units total) into the skin at bedtime.  10 mL  11  . metoprolol tartrate (LOPRESSOR) 12.5 mg TABS tablet Take 0.5 tablets (12.5 mg total) by mouth 2 (two) times daily.  1 tablet  0  . Multiple Vitamins-Minerals (MULTIVITAMIN PO) Take 1 tablet by mouth daily.       No current facility-administered medications for this visit.    No Known Allergies  History   Social History  . Marital Status: Widowed    Spouse Name: N/A    Number of Children: N/A  . Years of Education: N/A   Occupational History  . Not on file.   Social History Main Topics  . Smoking status: Former Research scientist (life sciences)  .  Smokeless tobacco: Never Used     Comment: quit over 30 years ago  . Alcohol Use: No     Comment: occ wine   . Drug Use: No  . Sexual Activity: Not on file   Other Topics Concern  . Not on file   Social History Narrative  . No narrative on file     Review of Systems: General: negative for chills, fever, night sweats or weight changes.  Cardiovascular: negative for chest pain, dyspnea on exertion, edema, orthopnea, palpitations, paroxysmal nocturnal dyspnea or shortness of  breath Dermatological: negative for rash Respiratory: negative for cough or wheezing Urologic: negative for hematuria Abdominal: negative for nausea, vomiting, diarrhea, bright red blood per rectum, melena, or hematemesis Neurologic: negative for visual changes, syncope, or dizziness All other systems reviewed and are otherwise negative except as noted above.    Blood pressure 110/60, pulse 93, height 5\' 3"  (1.6 m), weight 136 lb 9.6 oz (61.961 kg).  General appearance: alert, cooperative and no distress Neck: no JVD Lungs: clear to auscultation bilaterally Heart: irregularly irregular rhythm and 2/6 SM Extremities: 2+ bilateral LEE Pulses: 2+ and symmetric Skin: warm and dry Neurologic: Grossly normal  EKG Atrial Fibrillation: HR 93 bpm  ASSESSMENT AND PLAN:   1. Atrail Fibrillation: Pt continues in AFib, however, ventricular rate is controlled in the low 90s. Conintue rate control strategy with PO Cardizem and Metoprolol. Continue Eliquis for anticoagulation. She wishes to further discuss DCCV with her daughter. I feel that this is reasonable, given that she has good rate control and she is protected with Eliquis. However I strongly urge that she followup with [place] immediately soon after she has discussed with her daughter. She was instructed to use assistance with ambulating and to notify our office/seek immediate medical evaluation if she has any falls that result in head trauma. She was also instructed to report any abnormal bleeding. She verbalized understanding.  2. Chronic diastolic heart failure: EF of 50-55%. She denies dyspnea. She appears euvolemic on physical exam. Continue current dose of by mouth Lasix.  3. Hypertension: Blood pressure is well-controlled at 110/60. Continue meds.  PLAN   Continue current plan of care as outlined above. Patient was encouraged to have discussion with her daughter regarding possible  direct current cardioversion. She states that she will have  her daughter called office our notify us of their decision.  Brittainy SimmonsPA-C 10/09/2013 2:10 PM

## 2013-10-09 NOTE — Patient Instructions (Signed)
1. Increase your lasix to 40 mg two times a day for the next three days  2. Increase your potassium to 40 meq daily for three days  3. We will  recheck lab work in three days  4.Your physician recommends that you schedule a follow-up appointment in:  One to two weeks with either an NP or PA

## 2013-10-17 ENCOUNTER — Encounter: Payer: Self-pay | Admitting: *Deleted

## 2013-10-19 ENCOUNTER — Encounter: Payer: Self-pay | Admitting: Cardiology

## 2013-10-25 ENCOUNTER — Encounter: Payer: Self-pay | Admitting: Cardiology

## 2013-10-25 ENCOUNTER — Ambulatory Visit (INDEPENDENT_AMBULATORY_CARE_PROVIDER_SITE_OTHER): Payer: Medicare Other | Admitting: Cardiology

## 2013-10-25 VITALS — BP 124/52 | HR 95 | Ht 63.0 in | Wt 139.6 lb

## 2013-10-25 DIAGNOSIS — Z79899 Other long term (current) drug therapy: Secondary | ICD-10-CM

## 2013-10-25 DIAGNOSIS — D689 Coagulation defect, unspecified: Secondary | ICD-10-CM

## 2013-10-25 DIAGNOSIS — I4891 Unspecified atrial fibrillation: Secondary | ICD-10-CM

## 2013-10-25 DIAGNOSIS — R5383 Other fatigue: Secondary | ICD-10-CM

## 2013-10-25 DIAGNOSIS — R5381 Other malaise: Secondary | ICD-10-CM

## 2013-10-25 LAB — CBC
HCT: 34.3 % — ABNORMAL LOW (ref 36.0–46.0)
Hemoglobin: 10.6 g/dL — ABNORMAL LOW (ref 12.0–15.0)
MCH: 26 pg (ref 26.0–34.0)
MCHC: 30.9 g/dL (ref 30.0–36.0)
MCV: 84.3 fL (ref 78.0–100.0)
PLATELETS: 277 10*3/uL (ref 150–400)
RBC: 4.07 MIL/uL (ref 3.87–5.11)
RDW: 15 % (ref 11.5–15.5)
WBC: 5.4 10*3/uL (ref 4.0–10.5)

## 2013-10-25 MED ORDER — SODIUM CHLORIDE 0.9 % IV SOLN: INTRAVENOUS | Status: DC

## 2013-10-25 NOTE — Patient Instructions (Addendum)
Your physician has recommended that you have a Cardioversion (DCCV). Electrical Cardioversion uses a jolt of electricity to your heart either through paddles or wired patches attached to your chest. This is a controlled, usually prescheduled, procedure. Defibrillation is done under light anesthesia in the hospital, and you usually go home the day of the procedure. This is done to get your heart back into a normal rhythm. You are not awake for the procedure. Please see the instruction sheet given to you today.  You will be required to have bloodwork prior to your procedure.  Our scheduler will advise you on when these items need to be done.

## 2013-10-25 NOTE — Progress Notes (Signed)
Patient ID: Latasha Proctor, female   DOB: [birth year], 80 y.o.   MRN: [id number]    10/25/2013 Latasha Proctor   [birth year]  [id number]  Primary Physicia Latasha Stack, MD Primary Cardiologist: Dr. Ellyn Proctor  The patient presents to clinic today for followup of her atrial fibrillation. Details regarding her history  are outlined in detail below.  HPI:  This is a 80 y.o. female with a past medical history significant for diabetes type 2, HTN, macular degeneration and atrial fibrillation. In the past, she has not been considered a good candidate for chronic oral anticoagulation given a history of repeated falls. It was felt that her risk of being on oral anticoagulation outweighed potential benefit.   She was recently admitted to [facility] for treatment of atrial fibrillation. She initially  presented to [facility] on 09/19/2013 with complaints of dyspnea and fatigue. She was found to be in atrial fibrillation with RVR. She was treated initially with IV Cardizem and placed on IV heparin. TSH was normal as were cardiac enzymes. BMP was elevated at 3480, consistent with CHF. Chest x-ray demonstrated mild vascular congestion. She was treated with diuretics. A 2-D echocardiogram revealed normal systolic function with an EF of 55-60%. Visualization of her aortic valve revealed there was moderate stenosis with a valve area of 1.14 cm. She underwent a nuclear stress test which was negative for ischemia. She ultimately had improvement in her heart rate but continued in atrial fibrillation. It was felt she would benefit from direct current cardioversion to restore normal sinus rhythm. Thus, it was decided to transition her to a NOAC, reevaluate in 4 weeks and plan for cardioversion if still in atrial fibrillation. Once stabilized, she was discharged to a skilled nursing facility.  I evaluated her 10/09/2013 for post hospital followup. At that time, she reported to be doing well but continued in atrial  fibrillation with a controlled ventricular response. At that time she denied any awareness of her arrhythmia. She did endorse a desire to pursue direct current cardioversion, however she stated that she wanted to discuss this with her daughter before making a final decision. She was continued on her rate control medications as well as Eliquis twice a day for stroke prophylaxis. She was instructed to followup with our office after having a discussion with her daughter.  She presents back to clinic today for follow-up. She is accompanied by her daughter. They have discussed the situation together and has also had a conversation with her primary care provider, Dr. Forde Proctor. They have decided to pursue direct current cardioversion. He states that since her last office visit, she has continued to do well. She continues to deny any awareness of her arrhythmia, including palpitations, chest discomfort, dizziness, syncope/near-syncope. Since being at the skilled nursing facility, her mobility has been limited for fall precautions. She mainly ambulates by use of a wheelchair. However, she states that during her physical therapy sessions she does note mild dyspnea on exertion. She denies any resting dyspnea. She also denies orthopnea, PND and lower extremity edema. She states that the staff at the skilled nursing facility has been administering her medications. She presents to clinic today with her MAR. She has been receiving her prescribed Cardizem, Lopressor and Eliquis BID. She denies any falls or abnormal bleeding.  Her EKG today reveals atrial fibrillation with a controlled ventricular response. Heart rate is 95 beats per minute. Blood pressure is controlled at 124/52.     Current Outpatient Prescriptions  Medication Sig Dispense Refill  .  furosemide (LASIX) 20 MG tablet Take 20 mg by mouth daily.       Marland Kitchen apixaban (ELIQUIS) 2.5 MG TABS tablet Take 1 tablet (2.5 mg total) by mouth 2 (two) times daily.  1 tablet  0   . diltiazem (CARDIZEM CD) 240 MG 24 hr capsule Take 1 capsule (240 mg total) by mouth daily.  1 capsule  0  . insulin aspart (NOVOLOG) 100 UNIT/ML injection Inject 3 Units into the skin 3 (three) times daily with meals.  10 mL  11  . insulin glargine (LANTUS) 100 UNIT/ML injection Inject 0.05 mLs (5 Units total) into the skin at bedtime.  10 mL  11  . metoprolol tartrate (LOPRESSOR) 12.5 mg TABS tablet Take 0.5 tablets (12.5 mg total) by mouth 2 (two) times daily.  1 tablet  0  . Multiple Vitamins-Minerals (MULTIVITAMIN PO) Take 1 tablet by mouth daily.       No current facility-administered medications for this visit.    No Known Allergies  History   Social History  . Marital Status: Widowed    Spouse Name: N/A    Number of Children: 1  . Years of Education: 13   Occupational History  .     Social History Main Topics  . Smoking status: Former Smoker -- 10 years    Quit date: 10/18/1943  . Smokeless tobacco: Never Used     Comment: quit over 30 years ago  . Alcohol Use: No     Comment: occ wine   . Drug Use: No  . Sexual Activity: Not on file   Other Topics Concern  . Not on file   Social History Narrative  . No narrative on file     Review of Systems: General: negative for chills, fever, night sweats or weight changes.  Cardiovascular: negative for chest pain, dyspnea on exertion, edema, orthopnea, palpitations, paroxysmal nocturnal dyspnea or shortness of breath Dermatological: negative for rash Respiratory: negative for cough or wheezing Urologic: negative for hematuria Abdominal: negative for nausea, vomiting, diarrhea, bright red blood per rectum, melena, or hematemesis Neurologic: negative for visual changes, syncope, or dizziness All other systems reviewed and are otherwise negative except as noted above.    Blood pressure 124/52, pulse 95, height 5\' 3"  (1.6 m), weight 139 lb 9.6 oz (63.322 kg).  General appearance: alert, cooperative and no  distress Neck: no carotid bruit and no JVD Lungs: clear to auscultation bilaterally Heart: irregularly irregular rhythm and 2/6 SM Extremities: no LEE Pulses: 2+ pulses Skin: Warm and dry Neurologic: Grossly normal  EKG atrial fibrillation with controlled ventricular response. Heart rate 95 bpm.  ASSESSMENT AND PLAN:   1. Atrial fibrillation: Rate controlled with Cardizem and Lopressor. Ventricular rate currently in the 90s. Blood pressure is stable and she is asymptomatic. She has been on Eliquis 2.5 mg twice a day for stroke prophylaxis. As recommended by Dr. Gwenlyn Found, during her recent hospitalization, she agrees to undergo direct current cardioversion in an attempt to restore normal sinus rhythm. Since she has been anticoagulated with Eliquis, we will arrange for this to be done as an outpatient at [facility]. Until then, she has been instructed to continue her current medications as prescribed which include Eliquis twice a day, as well as Cardizem and Lopressor.  2. Chronic oral anticoagulation: On Eliquis for stroke prophylaxis for atrial fibrillation. She reports full compliance. She denies any recent falls and no abnormal bleeding. She states that she is on strict fall precautions at skilled  nursing facility. She has been instructed to use caution and to ask for assistance when ambulating. She has been instructed to report any falls, particularly those that result in head trauma and to report any abnormal bleeding. She verbalized understanding   PLAN  Continue Cardizem, Lopressor and Eliquis for atrial fibrillation. Plan for direct current cardioversion at [facility]. She will need to followup post cardioversion.  SIMMONS, BRITTAINYPA-C 10/25/2013 11:56 AM

## 2013-10-26 ENCOUNTER — Encounter (HOSPITAL_COMMUNITY)
Admission: RE | Disposition: A | Discharge status: Home / Self Care | Payer: Self-pay | Source: Ambulatory Visit | Attending: Internal Medicine

## 2013-10-26 ENCOUNTER — Encounter (HOSPITAL_COMMUNITY): Payer: Medicare Other | Admitting: Anesthesiology

## 2013-10-26 ENCOUNTER — Ambulatory Visit (HOSPITAL_COMMUNITY)
Admission: RE | Admit: 2013-10-26 | Discharge: 2013-10-26 | Disposition: A | Discharge status: Home / Self Care | Payer: Medicare Other | Source: Ambulatory Visit | Attending: Internal Medicine | Admitting: Internal Medicine

## 2013-10-26 ENCOUNTER — Ambulatory Visit (HOSPITAL_COMMUNITY): Payer: Medicare Other | Admitting: Anesthesiology

## 2013-10-26 ENCOUNTER — Encounter (HOSPITAL_COMMUNITY): Payer: Self-pay | Admitting: Anesthesiology

## 2013-10-26 DIAGNOSIS — N289 Disorder of kidney and ureter, unspecified: Secondary | ICD-10-CM | POA: Insufficient documentation

## 2013-10-26 DIAGNOSIS — I10 Essential (primary) hypertension: Secondary | ICD-10-CM | POA: Insufficient documentation

## 2013-10-26 DIAGNOSIS — E119 Type 2 diabetes mellitus without complications: Secondary | ICD-10-CM | POA: Insufficient documentation

## 2013-10-26 DIAGNOSIS — I480 Paroxysmal atrial fibrillation: Secondary | ICD-10-CM

## 2013-10-26 DIAGNOSIS — Z87891 Personal history of nicotine dependence: Secondary | ICD-10-CM | POA: Insufficient documentation

## 2013-10-26 DIAGNOSIS — I4891 Unspecified atrial fibrillation: Secondary | ICD-10-CM | POA: Insufficient documentation

## 2013-10-26 HISTORY — PX: CARDIOVERSION: SHX1299

## 2013-10-26 LAB — BASIC METABOLIC PANEL
BUN: 27 mg/dL — ABNORMAL HIGH (ref 6–23)
CALCIUM: 9.2 mg/dL (ref 8.4–10.5)
CHLORIDE: 97 meq/L (ref 96–112)
CO2: 31 meq/L (ref 19–32)
Creat: 1.04 mg/dL (ref 0.50–1.10)
GLUCOSE: 247 mg/dL — AB (ref 70–99)
Potassium: 5.1 mEq/L (ref 3.5–5.3)
SODIUM: 139 meq/L (ref 135–145)

## 2013-10-26 LAB — PROTIME-INR
INR: 1.22 (ref <1.50)
Prothrombin Time: 15.4 seconds — ABNORMAL HIGH (ref 11.6–15.2)

## 2013-10-26 LAB — APTT: APTT: 31 s (ref 24–37)

## 2013-10-26 LAB — TSH: TSH: 7.288 u[IU]/mL — AB (ref 0.350–4.500)

## 2013-10-26 SURGERY — CARDIOVERSION
Anesthesia: General

## 2013-10-26 MED ORDER — LIDOCAINE HCL (CARDIAC) 20 MG/ML IV SOLN
INTRAVENOUS | Status: DC | PRN
  Administered 2013-10-26: 45 mg via INTRAVENOUS

## 2013-10-26 MED ORDER — SODIUM CHLORIDE 0.9 % IV SOLN
INTRAVENOUS | Status: DC | PRN
  Administered 2013-10-26: 09:00:00 via INTRAVENOUS

## 2013-10-26 MED ORDER — PROPOFOL 10 MG/ML IV BOLUS
INTRAVENOUS | Status: DC | PRN
  Administered 2013-10-26: 25 mg via INTRAVENOUS

## 2013-10-26 NOTE — Anesthesia Preprocedure Evaluation (Signed)
Anesthesia Evaluation  Patient identified by MRN, date of birth, ID band Patient awake    Reviewed: Allergy & Precautions, H&P , NPO status , Patient's Chart, lab work & pertinent test results  Airway       Dental   Pulmonary former smoker,          Cardiovascular hypertension, + dysrhythmias Atrial Fibrillation     Neuro/Psych    GI/Hepatic   Endo/Other  diabetes, Type 2, Insulin Dependent, Oral Hypoglycemic Agents  Renal/GU Renal InsufficiencyRenal disease     Musculoskeletal   Abdominal   Peds  Hematology   Anesthesia Other Findings   Reproductive/Obstetrics                           Anesthesia Physical Anesthesia Plan  ASA: III  Anesthesia Plan: General   Post-op Pain Management:    Induction: Intravenous  Airway Management Planned: Mask  Additional Equipment:   Intra-op Plan:   Post-operative Plan:   Informed Consent: I have reviewed the patients History and Physical, chart, labs and discussed the procedure including the risks, benefits and alternatives for the proposed anesthesia with the patient or authorized representative who has indicated his/her understanding and acceptance.     Plan Discussed with:   Anesthesia Plan Comments:         Anesthesia Quick Evaluation

## 2013-10-26 NOTE — Anesthesia Postprocedure Evaluation (Signed)
  Anesthesia Post-op Note  Patient: Latasha Proctor  Procedure(s) Performed: Procedure(s): CARDIOVERSION (N/A)  Patient Location: Endoscopy Unit  Anesthesia Type:MAC  Level of Consciousness: awake, alert , oriented and patient cooperative  Airway and Oxygen Therapy: Patient Spontanous Breathing and Patient connected to nasal cannula oxygen  Post-op Pain: none  Post-op Assessment: Post-op Vital signs reviewed, Patient's Cardiovascular Status Stable, Respiratory Function Stable, Patent Airway and No signs of Nausea or vomiting  Post-op Vital Signs: Reviewed and stable  Last Vitals:  Filed Vitals:   10/26/13 0816  BP: 186/82  Pulse: 95  Temp: 36.8 C  Resp: 21    Complications: No apparent anesthesia complications

## 2013-10-26 NOTE — H&P (Signed)
     INTERVAL PROCEDURE H&P  History and Physical Interval Note:  10/26/2013 8:41 AM  Latasha Proctor has presented today for their planned procedure. The various methods of treatment have been discussed with the patient and family. After consideration of risks, benefits and other options for treatment, the patient has consented to the procedure.  The patients' outpatient history has been reviewed, patient examined, and no change in status from most recent office note within the past 30 days. I have reviewed the patients' chart and labs and will proceed as planned. Questions were answered to the patient's satisfaction.   Latasha Casino, MD, [facility] Attending Cardiologist [facility]  Latasha Proctor 10/26/2013, 8:41 AM

## 2013-10-26 NOTE — Transfer of Care (Signed)
Immediate Anesthesia Transfer of Care Note  Patient: Latasha Proctor  Procedure(s) Performed: Procedure(s): CARDIOVERSION (N/A)  Patient Location: Endoscopy Unit  Anesthesia Type:MAC  Level of Consciousness: awake, alert , oriented and patient cooperative  Airway & Oxygen Therapy: Patient Spontanous Breathing and Patient connected to nasal cannula oxygen  Post-op Assessment: Report given to PACU RN, Post -op Vital signs reviewed and stable and Patient moving all extremities X 4  Post vital signs: Reviewed and stable  Complications: No apparent anesthesia complications

## 2013-10-26 NOTE — CV Procedure (Signed)
    CARDIOVERSION NOTE  Procedure: Electrical Cardioversion Indications:  Atrial Fibrillation  Procedure Details:  Consent: Risks of procedure as well as the alternatives and risks of each were explained to the (patient/caregiver).  Consent for procedure obtained.  Time Out: Verified patient identification, verified procedure, site/side was marked, verified correct patient position, special equipment/implants available, medications/allergies/relevent history reviewed, required imaging and test results available.  Performed  Patient placed on cardiac monitor, pulse oximetry, supplemental oxygen as necessary.  Sedation given: Propofol per anesthesia Pacer pads placed anterior and posterior chest.  Cardioverted 2 time(s).  Cardioverted at 120J and 150J biphasic.  Impression: Findings: Post procedure EKG shows: NSR Complications: None Patient did tolerate procedure well.  Plan: 1. Successful DCCV after 2 shocks to NSR. 2. Continue Eliquis anticoagulation. 3.   Ok for discharge once awake and alert.  Time Spent Directly with the Patient:  30 minutes   [name], MD, [facility] Attending Cardiologist [facility]  HILTY,Kenneth C 10/26/2013, 9:26 AM

## 2013-10-26 NOTE — Discharge Instructions (Signed)
Electrical Cardioversion, Care After °Refer to this sheet in the next few weeks. These instructions provide you with information on caring for yourself after your procedure. Your health care provider may also give you more specific instructions. Your treatment has been planned according to current medical practices, but problems sometimes occur. Call your health care provider if you have any problems or questions after your procedure. °WHAT TO EXPECT AFTER THE PROCEDURE °After your procedure, it is typical to have the following sensations: °· Some redness on the skin where the shocks were delivered. If this is tender, a sunburn lotion or hydrocortisone cream may help. °· Possible return of an abnormal heart rhythm within hours or days after the procedure. °HOME CARE INSTRUCTIONS °· Only take medicine as directed by your health care provider. Be sure you understand how and when to take your medicine. °· Learn how to feel your pulse and check it often. °· Limit your activity for 48 hours after the procedure or as directed. °· Avoid or minimize caffeine and other stimulants as directed. °SEEK MEDICAL CARE IF: °· You feel like your heart is beating too fast or your pulse is not regular. °· You have any questions about your medicines. °· You have bleeding that will not stop. °SEEK IMMEDIATE MEDICAL CARE IF: °· You are dizzy or feel faint. °· It is hard to breathe or you feel short of breath. °· There is a change in discomfort in your chest. °· Your speech is slurred or you have trouble moving an arm or leg on one side of your body. °· You get a serious muscle cramp that does not go away. °· Your fingers or toes turn cold or blue. °MAKE SURE YOU:  °· Understand these instructions.   °· Will watch your condition.   °· Will get help right away if you are not doing well or get worse. °Document Released: 02/07/2013 Document Reviewed: 02/07/2013 °ExitCare® Patient Information ©2015 [employer]. This information is not  intended to replace advice given to you by your health care provider. Make sure you discuss any questions you have with your health care provider. ° °

## 2013-10-29 ENCOUNTER — Encounter (HOSPITAL_COMMUNITY): Payer: Self-pay | Admitting: Internal Medicine

## 2013-10-30 ENCOUNTER — Telehealth: Payer: Self-pay | Admitting: Internal Medicine

## 2013-10-30 MED ORDER — DILTIAZEM HCL ER COATED BEADS 300 MG PO CP24: 300.0000 mg | ORAL_CAPSULE | Freq: Every day | ORAL | Status: DC

## 2013-10-30 NOTE — Telephone Encounter (Signed)
Per Dr. Debara Pickett increase the Cardizem to 300mg  daily and see either Latasha Jester, PA or Dr. Ellyn Hack in follow-up.  Order called to [facility] at [employer].  Voiced understanding.  Will have scheduling call to schedule an appt.

## 2013-10-30 NOTE — Telephone Encounter (Signed)
Looks like she is back in a-fib. May need to increase cardizem CD dose to 300 or 360 mg. Will need to see Ellen Henri or Dr. Ellyn Hack (her cardiologist) about whether to add anti-arrythmic to consider repeat cardioversion or just rate control.  Dr. Lemmie Evens

## 2013-10-30 NOTE — Telephone Encounter (Signed)
RHONDA FROM [facility] CALLED TO REPORT THAT MRS Masek C/O BEING SOB LAST NIGHT.  SHE DID HAVE AN EPISODE DURING PHYSICAL THERAPY-- O2 SAT DECREASE DOWN TO 87% -  CXR , EKG WAS DONE   AFTER RESTING  O2 SAT 92%,   RHONDA WANTED DR HILTY TO BE AWARE SINCE PATIENT IS MOVING TO ANOTHER FACILITY TOMORROW. SHE FAXED EKG FOR DR HILTY'S REVIEW.  PATIENT LAST OFFICE VISIT WAS 10/25/13  WILL DEFER TO DR HILTY--,WITH EKG

## 2013-10-31 ENCOUNTER — Telehealth: Payer: Self-pay | Admitting: Cardiology

## 2013-10-31 NOTE — Telephone Encounter (Signed)
Wants to know if she really need to come to her appt on Monday? She just had an ekg yesterday.

## 2013-10-31 NOTE — Telephone Encounter (Signed)
RN spoke with patient's daughter [name]. Advised that patient come in on Monday for appmt with B.Simmons PA given that medication change was made yesterday and EKG from [facility] showed HR at 116 (AF with RVR). Daughter states it is hard to get her mother to and from appointments and that she just moved from [facility] to [place], but she will make all attempts to get her to the office.

## 2013-11-05 ENCOUNTER — Ambulatory Visit (INDEPENDENT_AMBULATORY_CARE_PROVIDER_SITE_OTHER): Payer: Medicare Other | Admitting: Cardiology

## 2013-11-05 ENCOUNTER — Encounter: Payer: Self-pay | Admitting: Cardiology

## 2013-11-05 VITALS — BP 160/84 | HR 103 | Ht 63.0 in | Wt 133.6 lb

## 2013-11-05 DIAGNOSIS — I4891 Unspecified atrial fibrillation: Secondary | ICD-10-CM

## 2013-11-05 DIAGNOSIS — I480 Paroxysmal atrial fibrillation: Secondary | ICD-10-CM

## 2013-11-05 MED ORDER — ONDANSETRON HCL 4 MG PO TABS: 4.0000 mg | ORAL_TABLET | Freq: Three times a day (TID) | ORAL | Status: DC | PRN

## 2013-11-05 MED ORDER — METOPROLOL TARTRATE 25 MG PO TABS: 25.0000 mg | ORAL_TABLET | Freq: Two times a day (BID) | ORAL | Status: DC

## 2013-11-05 MED ORDER — METOPROLOL TARTRATE 25 MG PO TABS: 25.0000 mg | ORAL_TABLET | Freq: Two times a day (BID) | ORAL | Status: AC

## 2013-11-05 NOTE — Progress Notes (Signed)
Patient ID: Latasha Proctor, female   DOB: 1917/07/04, 80 y.o.   MRN: [id number]    11/05/2013 HASNA STEFANIK   [birth year]  [id number]  Primary Physicia [name], MD Primary Cardiologist: Dr. Ellyn Hack  HPI:  Ms. Fischbach presents to clinic today regarding her atrial fibrillation.   She is a 80 y.o. female with a past medical history significant for diabetes type 2, HTN, macular degeneration and atrial fibrillation. In the past, she has not been considered a good candidate for chronic oral anticoagulation given a history of repeated falls. It was felt that her risk of being on oral anticoagulation outweighed potential benefit.   She was recently admitted to [facility] for treatment of atrial fibrillation. She initially presented to [facility] on 09/19/2013 with complaints of dyspnea and fatigue. She was found to be in atrial fibrillation with RVR. She was treated initially with IV Cardizem and placed on IV heparin. TSH was normal as were cardiac enzymes. BMP was elevated at 3480, consistent with CHF. Chest x-ray demonstrated mild vascular congestion. She was treated with diuretics. A 2-D echocardiogram revealed normal systolic function with an EF of 55-60%. Visualization of her aortic valve revealed there was moderate stenosis with a valve area of 1.14 cm. She underwent a nuclear stress test which was negative for ischemia. She ultimately had improvement in her heart rate but continued in atrial fibrillation. It was felt she would benefit from direct current cardioversion to restore normal sinus rhythm. Thus, it was decided to transition her to a NOAC, reevaluate in 4 weeks and plan for cardioversion if still in atrial fibrillation.This was ultimately done by Dr. Debara Pickett on 10/26/13. She was successfully cardioverted back to NSR after 2 shocks.  She presents back to clinic today for post-procedural f/u. Unfortunately, she has revereted back into atrial fibrillation. Her EKG today demonstrates a  resting ventricular rate of 103 bpm. Her BP is 160/84. She notes palpitations, fatigue and weakness. She has mild SOB, more so on exertion. No significant dyspnea at rest. She denies chest pain, dizziness, syncope/ near syncope. She states she has been compliant with her medication. She tells me that her daughter called the office several days ago when she started to feel poorly with elevated HRs. She was instructed to increase her Cardizem to 300 mg daily. She states that this has helped some (her HR day of office call was 116 bpm). She reports compliance with Eliquis.   Current Outpatient Prescriptions  Medication Sig Dispense Refill  . apixaban (ELIQUIS) 2.5 MG TABS tablet Take 1 tablet (2.5 mg total) by mouth 2 (two) times daily.  1 tablet  0  . diltiazem (CARDIZEM CD) 300 MG 24 hr capsule Take 1 capsule (300 mg total) by mouth daily.  30 capsule  6  . furosemide (LASIX) 20 MG tablet Take 20 mg by mouth daily.       . metoprolol tartrate (LOPRESSOR) 25 MG tablet Take 1 tablet (25 mg total) by mouth 2 (two) times daily.  60 tablet  6  . Multiple Vitamin (MULTIVITAMIN WITH MINERALS) TABS tablet Take 1 tablet by mouth daily.      . ondansetron (ZOFRAN) 4 MG tablet Take 1 tablet (4 mg total) by mouth every 8 (eight) hours as needed for nausea or vomiting.  20 tablet  0   No current facility-administered medications for this visit.    No Known Allergies  History   Social History  . Marital Status: Widowed    Spouse Name:  N/A    Number of Children: 1  . Years of Education: 13   Occupational History  .     Social History Main Topics  . Smoking status: Former Smoker -- 10 years    Quit date: 10/18/1943  . Smokeless tobacco: Never Used     Comment: quit over 30 years ago  . Alcohol Use: No     Comment: occ wine   . Drug Use: No  . Sexual Activity: Not on file   Other Topics Concern  . Not on file   Social History Narrative  . No narrative on file     Review of  Systems: General: negative for chills, fever, night sweats or weight changes.  Cardiovascular: negative for chest pain, dyspnea on exertion, edema, orthopnea, palpitations, paroxysmal nocturnal dyspnea or shortness of breath Dermatological: negative for rash Respiratory: negative for cough or wheezing Urologic: negative for hematuria Abdominal: negative for nausea, vomiting, diarrhea, bright red blood per rectum, melena, or hematemesis Neurologic: negative for visual changes, syncope, or dizziness All other systems reviewed and are otherwise negative except as noted above.    Blood pressure 160/84, pulse 103, height 5\' 3"  (1.6 m), weight 133 lb 9.6 oz (60.601 kg).  General appearance: alert, cooperative and no distress Neck: no carotid bruit and no JVD Lungs: clear to auscultation bilaterally Heart: irregularly irregular rhythm Extremities: no LEE Pulses: 2+ and symmetric Skin: war and dry Neurologic: Grossly normal  EKG Atrial fibrillation. Ventricular rate 103  ASSESSMENT AND PLAN:   1. PAF: patient reverted back into atrial fibrillation after successful cardioversion. Her resting HR is 103 bpm. She complains of fatigue, palpitations and mild DOE, but no dizziness, syncope/ near syncope or chest pain. No significant dyspnea at rest. Her BP is stable. Based on physical exam, she does not appear to be in acute HF. She needs better rate control. I have instructed her to increase BB (Lopressor) to 25 mg BID. Continue Cardizem at 300 mg daily. Continue Eliquis for stroke prophylaxis.   PLAN   She needs better rate control. We will continue her on 300 mg of Cardizem daily. We will further increase her Lopressor to 25 mg twice a day. Continue Eliquis for stroke prophylaxis. Instructions were written for her heart rate and blood pressure to be monitored at the assisted living facility. She has been instructed to notify our office if she continues to have persistent tachycardia with heart rates  greater than 100 or she continues to be symptomatic. If so, she may require further increases in her beta blocker. If we are unable to adequately control her rate, we may need to reconsider the idea repeat cardioversion or addition of antiarrhythmic medication. She has been instructed to seek urgent medical evaluation if she develops symptoms of hemodynamic instability.   SIMMONS, BRITTAINYPA-C 11/05/2013 5:25 PM

## 2013-11-05 NOTE — Patient Instructions (Signed)
Your physician recommends that you schedule a follow-up appointment in: 2 weeks or sooner if not feeling better  Your physician has recommended you make the following change in your medication: Increase Metoprolol to 25 mg daily, take Zofran 4 mg as needed for nausea

## 2013-11-06 ENCOUNTER — Emergency Department (HOSPITAL_COMMUNITY): Payer: Medicare Other

## 2013-11-06 ENCOUNTER — Inpatient Hospital Stay (HOSPITAL_COMMUNITY)
Admission: EM | Admit: 2013-11-06 | Discharge: 2013-11-09 | DRG: 291 | Disposition: A | Discharge status: Hospice | Payer: Medicare Other | Attending: Endocrinology | Admitting: Endocrinology

## 2013-11-06 ENCOUNTER — Encounter (HOSPITAL_COMMUNITY): Payer: Self-pay | Admitting: Emergency Medicine

## 2013-11-06 DIAGNOSIS — I080 Rheumatic disorders of both mitral and aortic valves: Secondary | ICD-10-CM | POA: Diagnosis present

## 2013-11-06 DIAGNOSIS — D509 Iron deficiency anemia, unspecified: Secondary | ICD-10-CM

## 2013-11-06 DIAGNOSIS — I509 Heart failure, unspecified: Secondary | ICD-10-CM | POA: Diagnosis present

## 2013-11-06 DIAGNOSIS — I10 Essential (primary) hypertension: Secondary | ICD-10-CM | POA: Diagnosis present

## 2013-11-06 DIAGNOSIS — E785 Hyperlipidemia, unspecified: Secondary | ICD-10-CM

## 2013-11-06 DIAGNOSIS — Z85820 Personal history of malignant melanoma of skin: Secondary | ICD-10-CM

## 2013-11-06 DIAGNOSIS — R809 Proteinuria, unspecified: Secondary | ICD-10-CM | POA: Diagnosis present

## 2013-11-06 DIAGNOSIS — R0902 Hypoxemia: Secondary | ICD-10-CM | POA: Diagnosis present

## 2013-11-06 DIAGNOSIS — Z823 Family history of stroke: Secondary | ICD-10-CM

## 2013-11-06 DIAGNOSIS — D72829 Elevated white blood cell count, unspecified: Secondary | ICD-10-CM | POA: Diagnosis present

## 2013-11-06 DIAGNOSIS — D649 Anemia, unspecified: Secondary | ICD-10-CM | POA: Diagnosis present

## 2013-11-06 DIAGNOSIS — R34 Anuria and oliguria: Secondary | ICD-10-CM | POA: Diagnosis present

## 2013-11-06 DIAGNOSIS — E039 Hypothyroidism, unspecified: Secondary | ICD-10-CM | POA: Diagnosis present

## 2013-11-06 DIAGNOSIS — Z87891 Personal history of nicotine dependence: Secondary | ICD-10-CM

## 2013-11-06 DIAGNOSIS — E43 Unspecified severe protein-calorie malnutrition: Secondary | ICD-10-CM

## 2013-11-06 DIAGNOSIS — E119 Type 2 diabetes mellitus without complications: Secondary | ICD-10-CM | POA: Diagnosis present

## 2013-11-06 DIAGNOSIS — Z961 Presence of intraocular lens: Secondary | ICD-10-CM

## 2013-11-06 DIAGNOSIS — I959 Hypotension, unspecified: Secondary | ICD-10-CM | POA: Diagnosis present

## 2013-11-06 DIAGNOSIS — I5032 Chronic diastolic (congestive) heart failure: Secondary | ICD-10-CM

## 2013-11-06 DIAGNOSIS — I480 Paroxysmal atrial fibrillation: Secondary | ICD-10-CM | POA: Diagnosis present

## 2013-11-06 DIAGNOSIS — Z9849 Cataract extraction status, unspecified eye: Secondary | ICD-10-CM

## 2013-11-06 DIAGNOSIS — R634 Abnormal weight loss: Secondary | ICD-10-CM | POA: Diagnosis present

## 2013-11-06 DIAGNOSIS — I4891 Unspecified atrial fibrillation: Secondary | ICD-10-CM | POA: Diagnosis present

## 2013-11-06 DIAGNOSIS — I5033 Acute on chronic diastolic (congestive) heart failure: Principal | ICD-10-CM | POA: Diagnosis present

## 2013-11-06 DIAGNOSIS — N183 Chronic kidney disease, stage 3 unspecified: Secondary | ICD-10-CM

## 2013-11-06 DIAGNOSIS — I129 Hypertensive chronic kidney disease with stage 1 through stage 4 chronic kidney disease, or unspecified chronic kidney disease: Secondary | ICD-10-CM | POA: Diagnosis present

## 2013-11-06 DIAGNOSIS — H9190 Unspecified hearing loss, unspecified ear: Secondary | ICD-10-CM | POA: Diagnosis present

## 2013-11-06 DIAGNOSIS — H353290 Exudative age-related macular degeneration, unspecified eye, stage unspecified: Secondary | ICD-10-CM | POA: Diagnosis present

## 2013-11-06 DIAGNOSIS — Z515 Encounter for palliative care: Secondary | ICD-10-CM

## 2013-11-06 DIAGNOSIS — Z833 Family history of diabetes mellitus: Secondary | ICD-10-CM

## 2013-11-06 DIAGNOSIS — R7989 Other specified abnormal findings of blood chemistry: Secondary | ICD-10-CM | POA: Diagnosis present

## 2013-11-06 DIAGNOSIS — R1310 Dysphagia, unspecified: Secondary | ICD-10-CM | POA: Diagnosis present

## 2013-11-06 DIAGNOSIS — N179 Acute kidney failure, unspecified: Secondary | ICD-10-CM

## 2013-11-06 DIAGNOSIS — Z66 Do not resuscitate: Secondary | ICD-10-CM | POA: Diagnosis present

## 2013-11-06 DIAGNOSIS — I2789 Other specified pulmonary heart diseases: Secondary | ICD-10-CM | POA: Diagnosis present

## 2013-11-06 DIAGNOSIS — Z79899 Other long term (current) drug therapy: Secondary | ICD-10-CM

## 2013-11-06 DIAGNOSIS — N170 Acute kidney failure with tubular necrosis: Secondary | ICD-10-CM | POA: Diagnosis present

## 2013-11-06 DIAGNOSIS — E860 Dehydration: Secondary | ICD-10-CM

## 2013-11-06 DIAGNOSIS — E875 Hyperkalemia: Secondary | ICD-10-CM

## 2013-11-06 DIAGNOSIS — N393 Stress incontinence (female) (male): Secondary | ICD-10-CM | POA: Diagnosis present

## 2013-11-06 DIAGNOSIS — I350 Nonrheumatic aortic (valve) stenosis: Secondary | ICD-10-CM | POA: Diagnosis present

## 2013-11-06 DIAGNOSIS — I359 Nonrheumatic aortic valve disorder, unspecified: Secondary | ICD-10-CM

## 2013-11-06 DIAGNOSIS — R4181 Age-related cognitive decline: Secondary | ICD-10-CM | POA: Diagnosis present

## 2013-11-06 DIAGNOSIS — R627 Adult failure to thrive: Secondary | ICD-10-CM | POA: Diagnosis present

## 2013-11-06 DIAGNOSIS — K761 Chronic passive congestion of liver: Secondary | ICD-10-CM | POA: Diagnosis present

## 2013-11-06 HISTORY — DX: Exudative age-related macular degeneration, bilateral, stage unspecified: H35.3230

## 2013-11-06 HISTORY — DX: Pulmonary hypertension, unspecified: I27.20

## 2013-11-06 HISTORY — DX: Unspecified severe protein-calorie malnutrition: E43

## 2013-11-06 HISTORY — DX: Chronic diastolic (congestive) heart failure: I50.32

## 2013-11-06 HISTORY — DX: Pleural effusion, not elsewhere classified: J90

## 2013-11-06 HISTORY — DX: Malignant melanoma of right upper limb, including shoulder: C43.61

## 2013-11-06 HISTORY — DX: Other specified disorders of bone density and structure, unspecified site: M85.80

## 2013-11-06 HISTORY — DX: Dysphagia, unspecified: R13.10

## 2013-11-06 HISTORY — DX: Solitary pulmonary nodule: R91.1

## 2013-11-06 HISTORY — DX: Type 2 diabetes mellitus without complications: E11.9

## 2013-11-06 HISTORY — DX: Abnormal weight loss: R63.4

## 2013-11-06 HISTORY — DX: Chronic kidney disease, stage 3 unspecified: N18.30

## 2013-11-06 HISTORY — DX: Nonrheumatic aortic (valve) stenosis: I35.0

## 2013-11-06 HISTORY — DX: Paroxysmal atrial fibrillation: I48.0

## 2013-11-06 HISTORY — DX: Other specified diseases of liver: K76.89

## 2013-11-06 HISTORY — DX: Chronic kidney disease, stage 3 (moderate): N18.3

## 2013-11-06 HISTORY — DX: Iron deficiency anemia, unspecified: D50.9

## 2013-11-06 HISTORY — DX: Rheumatic mitral stenosis: I05.0

## 2013-11-06 LAB — URINALYSIS, ROUTINE W REFLEX MICROSCOPIC
Bilirubin Urine: NEGATIVE
GLUCOSE, UA: 250 mg/dL — AB
Ketones, ur: NEGATIVE mg/dL
LEUKOCYTES UA: NEGATIVE
Nitrite: NEGATIVE
PH: 5.5 (ref 5.0–8.0)
Protein, ur: >300 mg/dL — AB
Specific Gravity, Urine: 1.024 (ref 1.005–1.030)
Urobilinogen, UA: 0.2 mg/dL (ref 0.0–1.0)

## 2013-11-06 LAB — COMPREHENSIVE METABOLIC PANEL
ALK PHOS: 120 U/L — AB (ref 39–117)
ALT: 241 U/L — ABNORMAL HIGH (ref 0–35)
AST: 250 U/L — AB (ref 0–37)
Albumin: 3.7 g/dL (ref 3.5–5.2)
Anion gap: 15 (ref 5–15)
BILIRUBIN TOTAL: 0.9 mg/dL (ref 0.3–1.2)
BUN: 40 mg/dL — AB (ref 6–23)
CHLORIDE: 98 meq/L (ref 96–112)
CO2: 31 mEq/L (ref 19–32)
Calcium: 9.4 mg/dL (ref 8.4–10.5)
Creatinine, Ser: 1.42 mg/dL — ABNORMAL HIGH (ref 0.50–1.10)
GFR calc Af Amer: 35 mL/min — ABNORMAL LOW (ref >90)
GFR calc non Af Amer: 31 mL/min — ABNORMAL LOW (ref >90)
Glucose, Bld: 265 mg/dL — ABNORMAL HIGH (ref 70–99)
POTASSIUM: 5.1 meq/L (ref 3.7–5.3)
Sodium: 144 mEq/L (ref 137–147)
Total Protein: 7 g/dL (ref 6.0–8.3)

## 2013-11-06 LAB — CBC WITH DIFFERENTIAL/PLATELET
BASOS PCT: 0 % (ref 0–1)
Basophils Absolute: 0 10*3/uL (ref 0.0–0.1)
EOS ABS: 0 10*3/uL (ref 0.0–0.7)
Eosinophils Relative: 0 % (ref 0–5)
HCT: 42.2 % (ref 36.0–46.0)
Hemoglobin: 12.6 g/dL (ref 12.0–15.0)
Lymphocytes Relative: 4 % — ABNORMAL LOW (ref 12–46)
Lymphs Abs: 0.6 10*3/uL — ABNORMAL LOW (ref 0.7–4.0)
MCH: 26.1 pg (ref 26.0–34.0)
MCHC: 29.9 g/dL — AB (ref 30.0–36.0)
MCV: 87.4 fL (ref 78.0–100.0)
Monocytes Absolute: 0.8 10*3/uL (ref 0.1–1.0)
Monocytes Relative: 6 % (ref 3–12)
Neutro Abs: 12.3 10*3/uL — ABNORMAL HIGH (ref 1.7–7.7)
Neutrophils Relative %: 90 % — ABNORMAL HIGH (ref 43–77)
PLATELETS: 220 10*3/uL (ref 150–400)
RBC: 4.83 MIL/uL (ref 3.87–5.11)
RDW: 15.6 % — ABNORMAL HIGH (ref 11.5–15.5)
WBC: 13.7 10*3/uL — ABNORMAL HIGH (ref 4.0–10.5)

## 2013-11-06 LAB — I-STAT TROPONIN, ED: Troponin i, poc: 0.07 ng/mL (ref 0.00–0.08)

## 2013-11-06 LAB — GLUCOSE, CAPILLARY: Glucose-Capillary: 196 mg/dL — ABNORMAL HIGH (ref 70–99)

## 2013-11-06 LAB — URINE MICROSCOPIC-ADD ON

## 2013-11-06 LAB — MRSA PCR SCREENING: MRSA BY PCR: NEGATIVE

## 2013-11-06 LAB — PRO B NATRIURETIC PEPTIDE: Pro B Natriuretic peptide (BNP): 21603 pg/mL — ABNORMAL HIGH (ref 0–450)

## 2013-11-06 MED ORDER — DILTIAZEM HCL ER COATED BEADS 300 MG PO CP24
300.0000 mg | ORAL_CAPSULE | Freq: Every day | ORAL | Status: DC
  Filled 2013-11-06: qty 1

## 2013-11-06 MED ORDER — POLYSACCHARIDE IRON COMPLEX 150 MG PO CAPS
150.0000 mg | ORAL_CAPSULE | Freq: Every day | ORAL | Status: DC
  Administered 2013-11-07: 150 mg via ORAL
  Filled 2013-11-06 (×2): qty 1

## 2013-11-06 MED ORDER — METOLAZONE 2.5 MG PO TABS
2.5000 mg | ORAL_TABLET | Freq: Once | ORAL | Status: AC
  Administered 2013-11-06: 2.5 mg via ORAL
  Filled 2013-11-06 (×2): qty 1

## 2013-11-06 MED ORDER — INSULIN ASPART 100 UNIT/ML ~~LOC~~ SOLN: 0.0000 [IU] | Freq: Every day | SUBCUTANEOUS | Status: DC

## 2013-11-06 MED ORDER — FUROSEMIDE 10 MG/ML IJ SOLN
80.0000 mg | Freq: Two times a day (BID) | INTRAMUSCULAR | Status: DC
  Administered 2013-11-06 – 2013-11-07 (×2): 80 mg via INTRAVENOUS
  Filled 2013-11-06 (×3): qty 8

## 2013-11-06 MED ORDER — POTASSIUM CHLORIDE CRYS ER 20 MEQ PO TBCR
20.0000 meq | EXTENDED_RELEASE_TABLET | Freq: Two times a day (BID) | ORAL | Status: DC
  Administered 2013-11-06: 20 meq via ORAL
  Filled 2013-11-06 (×3): qty 1

## 2013-11-06 MED ORDER — SODIUM CHLORIDE 0.9 % IV BOLUS (SEPSIS)
500.0000 mL | Freq: Once | INTRAVENOUS | Status: AC
  Administered 2013-11-06: 500 mL via INTRAVENOUS

## 2013-11-06 MED ORDER — APIXABAN 2.5 MG PO TABS
2.5000 mg | ORAL_TABLET | Freq: Two times a day (BID) | ORAL | Status: DC
  Administered 2013-11-06: 2.5 mg via ORAL
  Filled 2013-11-06 (×3): qty 1

## 2013-11-06 MED ORDER — ADULT MULTIVITAMIN W/MINERALS CH
1.0000 | ORAL_TABLET | Freq: Every day | ORAL | Status: DC
  Administered 2013-11-07 – 2013-11-08 (×2): 1 via ORAL
  Filled 2013-11-06 (×2): qty 1

## 2013-11-06 MED ORDER — AMIODARONE HCL IN DEXTROSE 360-4.14 MG/200ML-% IV SOLN
60.0000 mg/h | INTRAVENOUS | Status: AC
  Administered 2013-11-06: 60 mg/h via INTRAVENOUS
  Filled 2013-11-06: qty 200

## 2013-11-06 MED ORDER — INSULIN ASPART 100 UNIT/ML ~~LOC~~ SOLN
0.0000 [IU] | Freq: Three times a day (TID) | SUBCUTANEOUS | Status: DC
  Administered 2013-11-07: 2 [IU] via SUBCUTANEOUS
  Administered 2013-11-07 (×2): 1 [IU] via SUBCUTANEOUS
  Administered 2013-11-08: 2 [IU] via SUBCUTANEOUS

## 2013-11-06 MED ORDER — ONDANSETRON HCL 4 MG PO TABS: 4.0000 mg | ORAL_TABLET | Freq: Three times a day (TID) | ORAL | Status: DC | PRN

## 2013-11-06 MED ORDER — AMIODARONE HCL IN DEXTROSE 360-4.14 MG/200ML-% IV SOLN
30.0000 mg/h | INTRAVENOUS | Status: DC
  Administered 2013-11-07 – 2013-11-08 (×2): 30 mg/h via INTRAVENOUS
  Filled 2013-11-06 (×6): qty 200

## 2013-11-06 MED ORDER — AMIODARONE LOAD VIA INFUSION
150.0000 mg | Freq: Once | INTRAVENOUS | Status: AC
  Administered 2013-11-06: 150 mg via INTRAVENOUS
  Filled 2013-11-06: qty 83.34

## 2013-11-06 NOTE — ED Notes (Signed)
Pt denies dizziness. Pt is having nausea and decreased appetite. Unable to stand as well as she is normally able to

## 2013-11-06 NOTE — Progress Notes (Signed)
Unit CM UR Completed by [facility] ED CM  W. Huey Scalia RN  

## 2013-11-06 NOTE — Progress Notes (Signed)
Patient's blood pressure 72/46.  Amio gtt stopped and MD notified.  Night RN to follow up.

## 2013-11-06 NOTE — Consult Note (Signed)
Cardiology Consultation Note  Patient ID: Latasha Proctor, MRN: [id number], DOB/AGE: 78-Jan-1919 78 y.o. Admit date: 11/06/2013   Date of Consult: 11/06/2013 Primary Physician: [name], MD Primary Cardiologist: Hilty  Chief Complaint: palpitations, SOB, fatigue Reason for Consult: AF, CHF  HPI: 80 yo WF with past medical history significant for paroxysmal a-fib, CKD stage III, pulmonary HTN, mild mitral stenosis, moderate aortic stenosis, DM, HLD, HTN, iron deficiency anemia presents with 4-5 months of decline from her baseline. Prior to March, patient was independent and living at home performing all ADLs.  In March she started feeling more tired and in May her primary care doctor noted 75lbs of weight loss over a period of 8 months and a-fib in the 150s.  She was hospitalized from May 20th to the 26th due to a-fib w/RVR. A CT chest/abdomen and pelvis did not show evidence of obvious malignancy at this time (hepatic cysts only, pulm nodule), thyroid and cortisol testing were normal. She was also treated for aspiration pneumonia during this hospitalization  She was placed on Cardiazem for rate control and diuresed. She was started on Eliquis. 2D echo revealed normal systolic function with an EF of 55-60%, moderate AS. Bilateral pleural effusions were thought to be related to her low albumin state.  She was discharged to a rehab facility where she improved but was still did not reach her baseline functioning.  June 26th she underwent cardioversion for a-fib which was initially successful but 2 days later she was back in a-fib.    Today she presents to the ED with extreme fatigue where she is barely able to stand on her own, hypotension, a-fib (controlled rate), palpitations, SOB and decreased appetite. Most of this history was obtained from the pts daughter since the patient is sleepy and a poor historian. Patient was seen yesterday in the office by Ellen Henri.  She continued her $RemoveBefor'300mg'ALeMxFvmRGIW$  of  Cardizem daily and increased her Lopressor to $RemoveBefo'25mg'GDdsOlUXTPb$  BID due to HR 103.  Patient has received 1pm and 1am dose of the increased Lopressor.  Since May, she has had bilaterally swelling of her lower extremities Rt>Lt (prior surgery to left ankle).  Over the past few days she has had increasing SOB associated with episodes of palpitations which the patient describes as pounding/explosive.  These episodes will wake her from sleep.  She has been requiring extra pillows at night for her breathing.  She has also been getting nausea but has not vomited. Denies chest pain, lightheadedness, dizziness, fever, chills, hx of recent infection, cough. WBC is 13.7, BUN/Cr 40/1.42, alk phos 120, AST/ALT 250/241, UA with >300 protein, tr Hgb, CXR with CHF; cannot exclude superimposed bibasilar pneumonia. She was given a 500cc NS bolus in the ED.  Past Medical History  Diagnosis Date  . Arthritis   . Diabetes mellitus   . Hyperlipidemia   . Hypertension   . PAF (paroxysmal atrial fibrillation)     a. Hx of PAF for several years. b. s/p DCCV (2 shocks) 10/2013.  . Macular degeneration   . CKD (chronic kidney disease), stage III     stress incontinence, wears depends  . Chronic diastolic CHF (congestive heart failure)   . Moderate aortic stenosis     a. Mod by echo 08/2013.  . Mitral stenosis     a. Mild by echo 08/2013.  Marland Kitchen Severe protein-calorie malnutrition   . Unintentional weight loss     a. Per PCP note CT abd/pelvis 2015 did not indicate malignancy.  Marland Kitchen  Dysphagia   . Aspiration pneumonia     a. 08/2013 adm - possible.  . Iron deficiency anemia     a. Extensive w/u not pursued by pcp in setting of advanced age.  . Pulmonary HTN   . Bilateral pleural effusion     a. 08/2013 - felt due to low albumin state.  . Melanoma of right upper arm     a. s/p wide local excision.  . Hepatic cyst   . Osteopenia       Most Recent Cardiac Studies: Nuclear stress 08/2013 IMPRESSION:  No findings for infarction or  ischemia.  Normal wall motion and myocardial thickening with contraction.  Ejection fraction calculated at 53%  2D Echo 08/2013 - Left ventricle: The cavity size was normal. Wall thickness was normal. Systolic function was normal. The estimated ejection fraction was in the range of 55% to 60%. Wall motion was normal; there were no regional wall motion abnormalities. - Aortic valve: There was moderate stenosis. Valve area (VTI): 1.14 cm^2. Valve area (Vmax): 1.19 cm^2. - Mitral valve: Mildly calcified, moderately thickened annulus. The findings are consistent with mild stenosis. Valve area by continuity equation (using LVOT flow): 1.4 cm^2. - Left atrium: The atrium was moderately dilated. - Right atrium: The atrium was moderately dilated. - Pulmonary arteries: PA peak pressure: 37 mm Hg (S). - Pericardium, extracardiac: A trivial pericardial effusion was identified.   Surgical History:  Past Surgical History  Procedure Laterality Date  . Ankle surgery  2003    pins/bone graft  . Back surgery  2006    broke back - pins/rods  . Abdominal hysterectomy    . Melanoma excision  02/01/2012    Procedure: MELANOMA EXCISION;  Surgeon: Rolm Bookbinder, MD;  Location: [facility];  Service: General;  Laterality: Left;  wide excision left arm melanoma  . Transthoracic echocardiogram  08/2013    EF 55-60%, moderate aortic stenosis, mild mitral stenosis, moderately dilated LA & RA,   . Stress test  08/2013    chemical stress test  . Cardioversion N/A 10/26/2013    Procedure: CARDIOVERSION;  Surgeon: [name], MD;  Location: [facility];  Service: Cardiovascular;  Laterality: N/A;     Home Meds: Prior to Admission medications   Medication Sig Start Date End Date Taking? Authorizing Provider  apixaban (ELIQUIS) 2.5 MG TABS tablet Take 1 tablet (2.5 mg total) by mouth 2 (two) times daily. 09/25/13  Yes [name], MD  diltiazem (CARDIZEM CD) 300 MG 24 hr capsule Take 1 capsule (300 mg  total) by mouth daily. 10/30/13  Yes [name], MD  furosemide (LASIX) 40 MG tablet Take 40 mg by mouth daily.   Yes Historical Provider, MD  iron polysaccharides (NU-IRON) 150 MG capsule Take 150 mg by mouth daily.   Yes Historical Provider, MD  metoprolol tartrate (LOPRESSOR) 25 MG tablet Take 1 tablet (25 mg total) by mouth 2 (two) times daily. 11/05/13  Yes Brittainy Simmons, PA-C  Multiple Vitamin (MULTIVITAMIN WITH MINERALS) TABS tablet Take 1 tablet by mouth daily.   Yes Historical Provider, MD  ondansetron (ZOFRAN) 4 MG tablet Take 1 tablet (4 mg total) by mouth every 8 (eight) hours as needed for nausea or vomiting. 11/05/13  Yes Brittainy Simmons, PA-C  potassium chloride SA (K-DUR,KLOR-CON) 20 MEQ tablet Take 20 mEq by mouth daily.   Yes Historical Provider, MD    Inpatient Medications:    . sodium chloride      Allergies: No Known  Allergies  History   Social History  . Marital Status: Widowed    Spouse Name: N/A    Number of Children: 1  . Years of Education: 13   Occupational History  .     Social History Main Topics  . Smoking status: Former Smoker -- 10 years    Quit date: 10/18/1943  . Smokeless tobacco: Never Used     Comment: quit over 30 years ago  . Alcohol Use: No     Comment: occ wine   . Drug Use: No  . Sexual Activity: Not on file   Other Topics Concern  . Not on file   Social History Narrative  . No narrative on file     Family History  Problem Relation Age of Onset  . Diabetes Mother   . Stroke Father   . Stroke Brother 81     Review of Systems: see above. ROS difficult to obtain from patient due to AMS. Below is obtained from the daughter. General: negative for chills, fever, night sweats Cardiovascular: negative for chest pain, paroxysmal nocturnal dyspnea Dermatological: negative for rash Respiratory: negative for cough or wheezing Urologic: negative for hematuria Abdominal: negative for vomiting, diarrhea, bright red blood per  rectum, melena, or hematemesis Neurologic: negative for visual changes, syncope, or dizziness All other systems reviewed and are otherwise negative except as noted above.  Labs: Troponin neg x 1  Lab Results  Component Value Date   WBC 13.7* 11/06/2013   HGB 12.6 11/06/2013   HCT 42.2 11/06/2013   MCV 87.4 11/06/2013   PLT 220 11/06/2013    Recent Labs Lab 11/06/13 1310  NA 144  K 5.1  CL 98  CO2 31  BUN 40*  CREATININE 1.42*  CALCIUM 9.4  PROT 7.0  BILITOT 0.9  ALKPHOS 120*  ALT 241*  AST 250*  GLUCOSE 265*   Radiology/Studies:  Dg Chest 2 View 11/06/2013   CLINICAL DATA:  Shortness of Breath  EXAM: CHEST  2 VIEW  COMPARISON:  Sep 21, 2013 chest radiograph and Sep 22, 2013 chest CT  FINDINGS: There are bilateral pleural effusions with bibasilar consolidation. There is slight interstitial edema. Heart is mildly enlarged with pulmonary vascularity within normal limits. There is postoperative change in the thoracic spine. Bones are osteoporotic. There is extensive calcific tendinosis in the right shoulder.  IMPRESSION: Evidence of congestive heart failure. Superimposed bibasilar pneumonia cannot be excluded radiographically.   Electronically Signed   By: Lowella Grip M.D.   On: 11/06/2013 12:51    EKG: atrial fibrillation 93bpm, nonspeciifc ST-T changes  Physical Exam: Blood pressure 108/46, pulse 88, resp. rate 19, SpO2 100.00%. General: Thin elderly WF in no acute distress. Falls asleep easily. Head: Normocephalic, atraumatic, sclera non-icteric, no xanthomas, nares are without discharge.  Neck: Negative for carotid bruits. JVD elevated to the earlobe (+HJR). Lungs: Clear bilaterally to auscultation without wheezes, rales, or rhonchi. Breathing is unlabored. Heart: Irregular rhythm with S1 S2, controlled rate. No murmurs, rubs, or gallops appreciated. Abdomen: Soft, non-tender, mildly-distended with quiet bowel sounds. No hepatomegaly. No rebound/guarding. No obvious abdominal  masses. Msk:  Generalized atrophy. Extremities: No clubbing or cyanosis. R>L LE (pitting). Distal pedal pulses are 2+ and equal bilaterally. Neuro: Arousable from sleep but falls back asleep easily. Poor historian.   Assessment and Plan:   1. Failure to thrive 2. Leukocytosis/cough 3. Acute on chronic diastolic CHF 4. Paroxysmal atrial fibrillation, on Eliquis 5. Moderate aortic stenosis, mild mitral stenosis, mild TR by  echo 08/2013 6. Transaminitis, suspect due to passive congestion 7. Recently abnormal TSH 8. Diabetes mellitus with hyperglycemia 9. CKD stage III  Although there is certainly a major component of diastolic CHF on exam, it is hard to imagine that this has contributed entirely to her failure to thrive over the last 5 months. She has lost somewhere between 50-75lbs in the last year. She is admitted with a leukocytosis, proteinuria/mild Hgb, deep cough. IM will admit to manage primary medical issues.   Recommend to continue Cardizem and Eliquis at current dose. However, will discontinue metoprolol due to soft BP and CHF. Start IV Lasix $Remove'80mg'dkSbjLJ$  BID. Give one dose of metolazone 2.$RemoveBeforeD'5mg'LWpWzITpNEZeNA$  now. Recheck thyroid function. Start IV Amiodarone. See below for further thoughts.  Signed, Melina Copa PA-C 11/06/2013, 3:30 PM  Patient seen and examined with Melina Copa, PA-C. We discussed all aspects of the encounter. I agree with the assessment and plan as stated above.   Very difficult situation. 80 y/o woman with relatively precipitious decline in her functional status over the past few months. Recently underwent DC-CV of AF but reverted to AF in 2 days. Now presents with markedly decompensated HF in setting of diastolic HF and AF. i had a long talk with her daughter about her condition and the fact that I am concerned that her overall decline is multifactorial and that fixing her HF and AF may not restore her independence. I quoted her at least 50% mortality rate at 6 months.   That said I do  think we can make her feel better with diuresis and I think maintaining NSR may be an important goal but she will need an anti-arrhythmic to try and maintain NSR. She will be admitted by Dr. Baldwin Crown service. Start lasix 80 IV bid with one dose metolazone. Continue Eliquis. We will start IV amiodarone and see how she tolerates. If she tolerates well can consider DC-CV in a few days depending on her course.   We will follow.   Liat Mayol,MD 5:29 PM

## 2013-11-06 NOTE — ED Notes (Signed)
Per EMS- pt has had generalized weakness x1 day. Pt has recently changed her Cardizem and lopressor doses and family thinks that it may be due to that. Pt is alert oriented and ambulatory at baseline. Pt also noted to have bilateral leg swelling. Pt states that this has increased recently.

## 2013-11-06 NOTE — ED Notes (Signed)
Attempted report 

## 2013-11-06 NOTE — ED Provider Notes (Signed)
CSN: [id number]     Arrival date & time 11/06/13  1153 History   First MD Initiated Contact with Patient 11/06/13 1154     Chief Complaint  Patient presents with  . Weakness     (Consider location/radiation/quality/duration/timing/severity/associated sxs/prior Treatment) HPI Comments: Patient is a 80 year old female with history of diabetes, hypertension, and recently diagnosed atrial fibrillation. She is brought for evaluation of increased weakness that has been worsening over the past 7 days. She was recently hospitalized for atrial fibrillation and was started on Cardizem. The dose of this medication was increased yesterday. The family is noted that each day for the past week she is the common more weak and lethargic. She has not had much oral intake. The patient denies to me she is having any pain. She does report some shortness of breath and the family reports that she was "gasping for air"when they checked on her this morning.  Patient is a 80 y.o. female presenting with weakness. The history is provided by the patient.  Weakness This is a new problem. The current episode started more than 1 week ago. The problem occurs constantly. The problem has been gradually worsening. Pertinent negatives include no chest pain, no abdominal pain, no headaches and no shortness of breath. Nothing aggravates the symptoms. Nothing relieves the symptoms. She has tried nothing for the symptoms. The treatment provided no relief.    Past Medical History  Diagnosis Date  . Arthritis   . Diabetes mellitus   . Hyperlipidemia   . Hypertension   . Dysrhythmia     hx of atrial fib 2/13   . Macular degeneration   . Chronic kidney disease     stress incontinence, wears depends   Past Surgical History  Procedure Laterality Date  . Ankle surgery  2003    pins/bone graft  . Back surgery  2006    broke back - pins/rods  . Abdominal hysterectomy    . Melanoma excision  02/01/2012    Procedure: MELANOMA  EXCISION;  Surgeon: Rolm Bookbinder, MD;  Location: [facility];  Service: General;  Laterality: Left;  wide excision left arm melanoma  . Transthoracic echocardiogram  08/2013    EF 55-60%, moderate aortic stenosis, mild mitral stenosis, moderately dilated LA & RA,   . Stress test  08/2013    chemical stress test  . Cardioversion N/A 10/26/2013    Procedure: CARDIOVERSION;  Surgeon: [name], MD;  Location: [facility] ENDOSCOPY;  Service: Cardiovascular;  Laterality: N/A;   Family History  Problem Relation Age of Onset  . Diabetes Mother   . Stroke Father   . Stroke Brother 54   History  Substance Use Topics  . Smoking status: Former Smoker -- 10 years    Quit date: 10/18/1943  . Smokeless tobacco: Never Used     Comment: quit over 30 years ago  . Alcohol Use: No     Comment: occ wine    OB History   Grav Para Term Preterm Abortions TAB SAB Ect Mult Living                 Review of Systems  Respiratory: Negative for shortness of breath.   Cardiovascular: Negative for chest pain.  Gastrointestinal: Negative for abdominal pain.  Neurological: Positive for weakness. Negative for headaches.  All other systems reviewed and are negative.     Allergies  Review of patient's allergies indicates no known allergies.  Home Medications   Prior to Admission medications  Medication Sig Start Date End Date Taking? Authorizing Provider  apixaban (ELIQUIS) 2.5 MG TABS tablet Take 1 tablet (2.5 mg total) by mouth 2 (two) times daily. 09/25/13  Yes [name], MD  diltiazem (CARDIZEM CD) 300 MG 24 hr capsule Take 1 capsule (300 mg total) by mouth daily. 10/30/13  Yes [name], MD  furosemide (LASIX) 40 MG tablet Take 40 mg by mouth daily.   Yes Historical Provider, MD  iron polysaccharides (NU-IRON) 150 MG capsule Take 150 mg by mouth daily.   Yes Historical Provider, MD  metoprolol tartrate (LOPRESSOR) 25 MG tablet Take 1 tablet (25 mg total) by mouth 2 (two) times daily.  11/05/13  Yes Brittainy Simmons, PA-C  Multiple Vitamin (MULTIVITAMIN WITH MINERALS) TABS tablet Take 1 tablet by mouth daily.   Yes Historical Provider, MD  ondansetron (ZOFRAN) 4 MG tablet Take 1 tablet (4 mg total) by mouth every 8 (eight) hours as needed for nausea or vomiting. 11/05/13  Yes Brittainy Simmons, PA-C  potassium chloride SA (K-DUR,KLOR-CON) 20 MEQ tablet Take 20 mEq by mouth daily.   Yes Historical Provider, MD   BP 103/49  Pulse 95  Resp 22  SpO2 96% Physical Exam  Nursing note and vitals reviewed. Constitutional: She is oriented to person, place, and time.  Patient is an elderly female in no acute distress. She appears fatigued, but responds appropriately.  HENT:  Head: Normocephalic and atraumatic.  Mouth/Throat: Oropharynx is clear and moist.  Eyes: EOM are normal. Pupils are equal, round, and reactive to light.  Neck: Normal range of motion. Neck supple.  Cardiovascular: Normal rate and regular rhythm.  Exam reveals no gallop and no friction rub.   No murmur heard. Pulmonary/Chest: Effort normal and breath sounds normal. No respiratory distress. She has no wheezes.  Abdominal: Soft. Bowel sounds are normal. She exhibits no distension. There is no tenderness.  Musculoskeletal: Normal range of motion.  Neurological: She is alert and oriented to person, place, and time.  Skin: Skin is warm and dry.    ED Course  Procedures (including critical care time) Labs Review Labs Reviewed  CBC WITH DIFFERENTIAL  COMPREHENSIVE METABOLIC PANEL  PRO B NATRIURETIC PEPTIDE  URINALYSIS, ROUTINE W REFLEX MICROSCOPIC  I-STAT [place], ED    Imaging Review No results found.   EKG Interpretation   Date/Time:  Tuesday November 06 2013 12:06:01 EDT Ventricular Rate:  93 PR Interval:    QRS Duration: 89 QT Interval:  373 QTC Calculation: 464 R Axis:   115 Text Interpretation:  Right and left arm electrode reversal,  interpretation assumes no reversal Atrial fibrillation  Ventricular  premature complex Probable lateral infarct, age indeterminate Probable  anteroseptal infarct, old Confirmed by DELOS  MD, Teoman Giraud ([id number]) on  11/06/2013 2:01:09 PM      MDM   Final diagnoses:  None    Patient presents with generalized weakness and shortness of breath that has been worsening over the past week. She is recently diagnosed with atrial fibrillation. Workup today reveals congestive heart failure on chest x-ray and an elevated BNP. Has also not had much by mouth intake for the past several days and her renal function is consistent with mild dehydration.  Discuss case with Dr. Forde Dandy who is the patient's primary doctor. We have agreed that cardiology will evaluate the patient and make the recommendations. Whether the patient is admitted to cardiology or to Dr. Forde Dandy will be determined once evaluated by cardiology.    Veryl Speak, MD 11/06/13  1554 

## 2013-11-06 NOTE — Progress Notes (Signed)
Dr. Forde Dandy paged about patient's blood pressure being 72/46 manually.  He stated to call cardiology.  Ignacia Bayley returned page. He ordered to decrease amiodorone to 16.30ml/hr (the rate we would decrease it to) and to hold the lasix if the blood pressure is less than 90.  Night nurse made aware and will continue to monitor patient.

## 2013-11-06 NOTE — ED Notes (Signed)
Speech therapy paged  °

## 2013-11-06 NOTE — ED Notes (Signed)
PA student at bedside.

## 2013-11-06 NOTE — ED Notes (Signed)
MD at bedside. 

## 2013-11-06 NOTE — H&P (Signed)
PCP:   [name], MD   Chief Complaint:  weakness  HPI: 80 YO WF with recent CHF admission followed by a SNF stay under my care until 07/01. Seen 06/30 and was back in AFib after an initially successful cardioversion a few days before. Had min edema and mostly clear lungs and felt pretty well at that point. Now she presents with florid CHF with edema, rales, hepatic congestion and hypoxia. She has been getting weak for a few days. She has had no CP. Oral intake has been fair at best.  She has been seen by cards and is getting amiodarone, IV diuretics and supplemental O2. She is feeling a little better. She has no current CP. She is thirsty but not really hungry. She notes no abd pain. Prior to the last hospitalization she had over 50 lbs of unintentional weight loss and was found to be in Afib.   Review of Systems:  Review of Systems - Negative except as above Past Medical History: Past Medical History  Diagnosis Date  . Arthritis   . Diabetes mellitus   . Hyperlipidemia   . Hypertension   . PAF (paroxysmal atrial fibrillation)     a. Hx of PAF for several years. b. s/p DCCV (2 shocks) 10/2013.  . Macular degeneration   . CKD (chronic kidney disease), stage III     stress incontinence, wears depends  . Chronic diastolic CHF (congestive heart failure)   . Moderate aortic stenosis     a. Mod by echo 08/2013.  . Mitral stenosis     a. Mild by echo 08/2013.  Marland Kitchen Severe protein-calorie malnutrition   . Unintentional weight loss     a. Per PCP note CT abd/pelvis 2015 did not indicate malignancy.  Marland Kitchen Dysphagia   . Aspiration pneumonia     a. 08/2013 adm - possible.  . Iron deficiency anemia     a. Extensive w/u not pursued by pcp in setting of advanced age.  . Pulmonary HTN   . Bilateral pleural effusion     a. 08/2013 - felt due to low albumin state.  . Melanoma of right upper arm     a. s/p wide local excision.  . Hepatic cyst   . Osteopenia   . Pulmonary nodule     a. 49mm  by CT 08/2013.   Past Surgical History  Procedure Laterality Date  . Ankle surgery  2003    pins/bone graft  . Back surgery  2006    broke back - pins/rods  . Abdominal hysterectomy    . Melanoma excision  02/01/2012    Procedure: MELANOMA EXCISION;  Surgeon: Rolm Bookbinder, MD;  Location: [facility];  Service: General;  Laterality: Left;  wide excision left arm melanoma  . Transthoracic echocardiogram  08/2013    EF 55-60%, moderate aortic stenosis, mild mitral stenosis, moderately dilated LA & RA,   . Stress test  08/2013    chemical stress test  . Cardioversion N/A 10/26/2013    Procedure: CARDIOVERSION;  Surgeon: [name], MD;  Location: [facility];  Service: Cardiovascular;  Laterality: N/A;    Medications: Prior to Admission medications   Medication Sig Start Date End Date Taking? Authorizing Provider  apixaban (ELIQUIS) 2.5 MG TABS tablet Take 1 tablet (2.5 mg total) by mouth 2 (two) times daily. 09/25/13  Yes [name], MD  diltiazem (CARDIZEM CD) 300 MG 24 hr capsule Take 1 capsule (300 mg total) by mouth daily. 10/30/13  Yes  [name], MD  iron polysaccharides (NU-IRON) 150 MG capsule Take 150 mg by mouth daily.   Yes Historical Provider, MD  metoprolol tartrate (LOPRESSOR) 25 MG tablet Take 1 tablet (25 mg total) by mouth 2 (two) times daily. 11/05/13  Yes Brittainy Simmons, PA-C  Multiple Vitamin (MULTIVITAMIN WITH MINERALS) TABS tablet Take 1 tablet by mouth daily.   Yes Historical Provider, MD  ondansetron (ZOFRAN) 4 MG tablet Take 1 tablet (4 mg total) by mouth every 8 (eight) hours as needed for nausea or vomiting. 11/05/13  Yes Brittainy Simmons, PA-C  potassium chloride SA (K-DUR,KLOR-CON) 20 MEQ tablet Take 20 mEq by mouth daily.   Yes Historical Provider, MD    Allergies:  No Known Allergies  Social History:  reports that she quit smoking about 70 years ago. She has never used smokeless tobacco. She reports that she does not drink alcohol or use  illicit drugs.  Family History: Family History  Problem Relation Age of Onset  . Diabetes Mother   . Stroke Father   . Stroke Brother 45    Physical Exam: Filed Vitals:   11/06/13 1715 11/06/13 1730 11/06/13 1745 11/06/13 1828  BP: 100/53 107/56 115/43 111/62  Pulse: 73 87 80 87  Temp:      TempSrc:      Resp: 21 21 20 18   SpO2: 96% 95% 98% 98%   General appearance: frail WF with O2 in place, face  Symmetric, dentition fair Head: Normocephalic, without obvious abnormality, atraumatic  anicteric, no nystagmus Neck: no adenopathy, no carotid bruit, JVD to jaw angle, symmetric,   Resp: coarse bilat rales Cardio: Tachy IR IR with aortic murmur GI: protuberant, non-tender; bowel sounds normal; no masses,  no organomegaly Extremities: 2+ edema. Pulses pretty good  Neurologic: awake Alert and oriented X 3,globally weak, mentating well. Joking with me  Labs on Admission:   Recent Labs  11/06/13 1310  NA 144  K 5.1  CL 98  CO2 31  GLUCOSE 265*  BUN 40*  CREATININE 1.42*  CALCIUM 9.4    Recent Labs  11/06/13 1310  AST 250*  ALT 241*  ALKPHOS 120*  BILITOT 0.9  PROT 7.0  ALBUMIN 3.7     Recent Labs  11/06/13 1310  WBC 13.7*  NEUTROABS 12.3*  HGB 12.6  HCT 42.2  MCV 87.4  PLT 220    Lab Results  Component Value Date   INR 1.22 10/25/2013   INR 1.51* 05/27/2010      Radiological Exams on Admission: Dg Chest 2 View  11/06/2013   CLINICAL DATA:  Shortness of Breath  EXAM: CHEST  2 VIEW  COMPARISON:  Sep 21, 2013 chest radiograph and Sep 22, 2013 chest CT  FINDINGS: There are bilateral pleural effusions with bibasilar consolidation. There is slight interstitial edema. Heart is mildly enlarged with pulmonary vascularity within normal limits. There is postoperative change in the thoracic spine. Bones are osteoporotic. There is extensive calcific tendinosis in the right shoulder.  IMPRESSION: Evidence of congestive heart failure. Superimposed bibasilar  pneumonia cannot be excluded radiographically.   Electronically Signed   By: Lowella Grip M.D.   On: 11/06/2013 12:51   Orders placed during the hospital encounter of 11/06/13  . EKG 12-LEAD: 06-Nov-2013 12:06:01 [facility]/ED ROUTINE RECORD Right and left arm electrode reversal, interpretation assumes no reversal Atrial fibrillation Ventricular premature complex Probable lateral infarct, age indeterminate Probable anteroseptal infarct, old  . EKG 12-LEAD   MAY 2015 ECHO Study Conclusions  -  Left ventricle: The cavity size was normal. Wall thickness was normal. Systolic function was normal. The estimated ejection fraction was in the range of 55% to 60%. Wall motion was normal; there were no regional wall motion abnormalities. - Aortic valve: There was moderate stenosis. Valve area (VTI): 1.14 cm^2. Valve area (Vmax): 1.19 cm^2. - Mitral valve: Mildly calcified, moderately thickened annulus. The findings are consistent with mild stenosis. Valve area by continuity equation (using LVOT flow): 1.4 cm^2. - Left atrium: The atrium was moderately dilated. - Right atrium: The atrium was moderately dilated. - Pulmonary arteries: PA peak pressure: 37 mm Hg (S). - Pericardium, extracardiac: A trivial pericardial effusion was identified  Assessment/Plan CONGESTIVE HEART FAILURE:    Acute on chronic diastolic heart failure Pt is signficantly in CHF over a fairly short period of time. Likely this is worsened by being back in Afib as well as her Aortic stenosis.  Active Problems: ATRIAL FIBRILLATION: in Afib with RVR, now getting amiodarone. May need cardioversion.   Weight loss, unintentional: likely due to cardiac dz   Failure to thrive in adult: will be tough to reverse   Moderate aortic stenosis   Hypertension: BP reasonable   Hepatic congestion: due to CHF   Abnormal TSH: will need Rx at some point. Watch carefully with iodine load of amiodarone Elevated sugars:  now over 200. Will add SSI  DNR status   Jayveion Stalling ALAN 11/06/2013, 6:37 PM

## 2013-11-06 NOTE — ED Notes (Signed)
Speech therapy paged again for swallow eval

## 2013-11-07 DIAGNOSIS — N179 Acute kidney failure, unspecified: Secondary | ICD-10-CM

## 2013-11-07 DIAGNOSIS — R634 Abnormal weight loss: Secondary | ICD-10-CM

## 2013-11-07 DIAGNOSIS — Z515 Encounter for palliative care: Secondary | ICD-10-CM

## 2013-11-07 DIAGNOSIS — E43 Unspecified severe protein-calorie malnutrition: Secondary | ICD-10-CM

## 2013-11-07 DIAGNOSIS — E875 Hyperkalemia: Secondary | ICD-10-CM

## 2013-11-07 DIAGNOSIS — I509 Heart failure, unspecified: Secondary | ICD-10-CM

## 2013-11-07 DIAGNOSIS — R627 Adult failure to thrive: Secondary | ICD-10-CM

## 2013-11-07 LAB — GLUCOSE, CAPILLARY
GLUCOSE-CAPILLARY: 187 mg/dL — AB (ref 70–99)
GLUCOSE-CAPILLARY: 142 mg/dL — AB (ref 70–99)
Glucose-Capillary: 137 mg/dL — ABNORMAL HIGH (ref 70–99)
Glucose-Capillary: 146 mg/dL — ABNORMAL HIGH (ref 70–99)

## 2013-11-07 LAB — BASIC METABOLIC PANEL
Anion gap: 17 — ABNORMAL HIGH (ref 5–15)
BUN: 49 mg/dL — AB (ref 6–23)
CHLORIDE: 96 meq/L (ref 96–112)
CO2: 23 meq/L (ref 19–32)
CREATININE: 2.4 mg/dL — AB (ref 0.50–1.10)
Calcium: 8.8 mg/dL (ref 8.4–10.5)
GFR calc non Af Amer: 16 mL/min — ABNORMAL LOW (ref >90)
GFR, EST AFRICAN AMERICAN: 19 mL/min — AB (ref >90)
Glucose, Bld: 143 mg/dL — ABNORMAL HIGH (ref 70–99)
POTASSIUM: 6.6 meq/L — AB (ref 3.7–5.3)
Sodium: 136 mEq/L — ABNORMAL LOW (ref 137–147)

## 2013-11-07 LAB — TSH: TSH: 7.91 u[IU]/mL — ABNORMAL HIGH (ref 0.350–4.500)

## 2013-11-07 LAB — T4, FREE: Free T4: 0.81 ng/dL (ref 0.80–1.80)

## 2013-11-07 MED ORDER — SODIUM CHLORIDE 0.9 % IV SOLN
INTRAVENOUS | Status: DC
  Administered 2013-11-07: 13:00:00 via INTRAVENOUS

## 2013-11-07 MED ORDER — DILTIAZEM HCL ER COATED BEADS 180 MG PO CP24
180.0000 mg | ORAL_CAPSULE | Freq: Every day | ORAL | Status: DC
  Administered 2013-11-07 – 2013-11-09 (×3): 180 mg via ORAL
  Filled 2013-11-07 (×3): qty 1

## 2013-11-07 MED ORDER — GLUCERNA SHAKE PO LIQD
237.0000 mL | Freq: Three times a day (TID) | ORAL | Status: DC
  Administered 2013-11-07 – 2013-11-09 (×5): 237 mL via ORAL
  Filled 2013-11-07: qty 237

## 2013-11-07 NOTE — Progress Notes (Signed)
INITIAL NUTRITION ASSESSMENT  DOCUMENTATION CODES Per approved criteria  -Not Applicable   INTERVENTION: Provide Glucerna Shakes TID with meals, each supplement provides 220 kcal and 10 grams of protein Provide Ensure Pudding once daily RD to continue to monitor  NUTRITION DIAGNOSIS: Inadequate oral intake related to poor appetite as evidenced by pt refusing meals.   Goal: Pt to meet >/= 90% of their estimated nutrition needs    Monitor:  PO intake, weight trend, labs  Reason for Assessment: Malnutrition Screening Tool (MST), score of 5  80 y.o. female  Admitting Dx: <principal problem not specified>  ASSESSMENT: 80 YO WF with recent CHF admission followed by a SNF stay under my care until 07/01. Seen 06/30 and was back in AFib after an initially successful cardioversion a few days before. Had min edema and mostly clear lungs and felt pretty well at that point. Now she presents with florid CHF with edema, rales, hepatic congestion and hypoxia. She has been getting weak for a few days. She has had no CP. Oral intake has been fair at best.   Per MST report pt lost 75 lbs in 9 months up until May 2015; since May she has re-gained 20 lbs. Pt seen by RD during admission in May at which time patient patient weighed 114 lbs. Pt asleep at time of visit. Per patient's daughter patient has been eating well for the past couple of months. When patient's strength improved her appetite improved; she has been eating 3 meals, drinking Ensure, and eating Magic cup ice cream. Pt has not wanted to eat for the past few days, stating she is not hungry.  Per MD note, pt did not want to eat breakfast but, wanted something to drink.  Labs: Elevated glucose, elevated AST and ALT  Height: Ht Readings from Last 1 Encounters:  11/06/13 5\' 3"  (1.6 m)    Weight: Wt Readings from Last 1 Encounters:  11/07/13 136 lb 6.4 oz (61.871 kg)    Ideal Body Weight: 115 lbs  % Ideal Body Weight: 118%  Wt  Readings from Last 10 Encounters:  11/07/13 136 lb 6.4 oz (61.871 kg)  11/05/13 133 lb 9.6 oz (60.601 kg)  10/25/13 139 lb 9.6 oz (63.322 kg)  10/09/13 136 lb 9.6 oz (61.961 kg)  09/25/13 113 lb 4.8 oz (51.393 kg)  02/21/12 170 lb (77.111 kg)  02/01/12 170 lb (77.11 kg)  02/01/12 170 lb (77.11 kg)  01/31/12 170 lb (77.111 kg)  01/24/12 171 lb 12.8 oz (77.928 kg)    Usual Body Weight: 160 lbs  % Usual Body Weight: 85%  BMI:  Body mass index is 24.17 kg/(m^2).  Estimated Nutritional Needs: Kcal: 1400-1600 Protein: 65-75 grams Fluid: 1.4-1.6 L/day  Skin: +3 RLE and LLE edema; intact  Diet Order: Dysphagia 2, thin liquids  EDUCATION NEEDS: -No education needs identified at this time   Intake/Output Summary (Last 24 hours) at 11/07/13 1024 Last data filed at 11/07/13 0657  Gross per 24 hour  Intake 438.85 ml  Output     10 ml  Net 428.85 ml    Last BM: PTA   Labs:   Recent Labs Lab 11/06/13 1310  NA 144  K 5.1  CL 98  CO2 31  BUN 40*  CREATININE 1.42*  CALCIUM 9.4  GLUCOSE 265*    CBG (last 3)   Recent Labs  11/06/13 2203 11/07/13 0548  GLUCAP 196* 187*    Scheduled Meds: . apixaban  2.5 mg Oral BID  .  diltiazem  180 mg Oral Daily  . furosemide  80 mg Intravenous BID  . insulin aspart  0-5 Units Subcutaneous QHS  . insulin aspart  0-9 Units Subcutaneous TID WC  . iron polysaccharides  150 mg Oral Daily  . multivitamin with minerals  1 tablet Oral Daily    Continuous Infusions: . amiodarone 30 mg/hr (11/07/13 0027)    Past Medical History  Diagnosis Date  . Hyperlipidemia   . Hypertension   . PAF (paroxysmal atrial fibrillation)     a. Hx of PAF for several years. b. s/p DCCV (2 shocks) 10/2013.  . Macular degeneration   . CKD (chronic kidney disease), stage III     stress incontinence, wears depends  . Chronic diastolic CHF (congestive heart failure)   . Moderate aortic stenosis     a. Mod by echo 08/2013.  . Mitral stenosis      a. Mild by echo 08/2013.  Marland Kitchen Severe protein-calorie malnutrition   . Unintentional weight loss     a. Per PCP note CT abd/pelvis 2015 did not indicate malignancy.  Marland Kitchen Dysphagia   . Iron deficiency anemia     a. Extensive w/u not pursued by pcp in setting of advanced age.  . Pulmonary HTN   . Bilateral pleural effusion     a. 08/2013 - felt due to low albumin state.  . Melanoma of right upper arm     a. s/p wide local excision.  . Hepatic cyst   . Osteopenia   . Pulmonary nodule     a. 69mm by CT 08/2013.  . Type II diabetes mellitus   . Age-related macular degeneration, wet, both eyes     Past Surgical History  Procedure Laterality Date  . Ankle surgery Right 2003    pins/bone graft  . Melanoma excision  02/01/2012    Procedure: MELANOMA EXCISION;  Surgeon: Rolm Bookbinder, MD;  Location: [facility];  Service: General;  Laterality: Left;  wide excision left arm melanoma  . Transthoracic echocardiogram  08/2013    EF 55-60%, moderate aortic stenosis, mild mitral stenosis, moderately dilated LA & RA,   . Stress test  08/2013    chemical stress test  . Cardioversion N/A 10/26/2013    Procedure: CARDIOVERSION;  Surgeon: [name], MD;  Location: [facility];  Service: Cardiovascular;  Laterality: N/A;  . Vaginal hysterectomy    . Back surgery  2006    broke upper back - pins/rods  . Cataract extraction w/ intraocular lens  implant, bilateral Bilateral     Pryor Ochoa RD, LDN Inpatient Clinical Dietitian Pager: [phone] After Hours Pager: [phone]

## 2013-11-07 NOTE — Progress Notes (Signed)
Patient ID: Latasha Proctor, female   DOB: 05-16-1917, 80 y.o.   MRN: [id number]  Talked with Dr Renaee Munda, basically anuric with rising K and doubling of SCr, Agree with plans for palliative care. [facility]

## 2013-11-07 NOTE — Progress Notes (Signed)
Subjective: Had a fair night. Feels like she is breathing a little better today. No UOP noted despite lasix Breakfast untouched but wants to drink something. Concerns about swallowing noted   Objective: Vital signs in last 24 hours: Temp:  [97.5 F (36.4 C)-98.8 F (37.1 C)] 97.6 F (36.4 C) (07/08 0446) Pulse Rate:  [61-103] 83 (07/08 0446) Resp:  [17-24] 20 (07/08 0446) BP: (72-117)/(43-62) 115/51 mmHg (07/08 0446) SpO2:  [94 %-100 %] 95 % (07/08 0446) Weight:  [61.871 kg (136 lb 6.4 oz)-61.916 kg (136 lb 8 oz)] 61.871 kg (136 lb 6.4 oz) (07/08 0446)  Intake/Output from previous day: 07/07 0701 - 07/08 0700 In: 438.9 [P.O.:240; I.V.:198.9] Out: 10 [Urine:10] Intake/Output this shift:    Frail with O2 in place. Lungs still diminished at bases. Ht IR IR with aortic murmur (afib on monitor) abd distended. extrems 3+ edema. Awake. mentating OK  Lab Results   Recent Labs  11/06/13 1310  WBC 13.7*  RBC 4.83  HGB 12.6  HCT 42.2  MCV 87.4  MCH 26.1  RDW 15.6*  PLT 220    Recent Labs  11/06/13 1310  NA 144  K 5.1  CL 98  CO2 31  GLUCOSE 265*  BUN 40*  CREATININE 1.42*  CALCIUM 9.4    Studies/Results: Dg Chest 2 View  11/06/2013   CLINICAL DATA:  Shortness of Breath  EXAM: CHEST  2 VIEW  COMPARISON:  Sep 21, 2013 chest radiograph and Sep 22, 2013 chest CT  FINDINGS: There are bilateral pleural effusions with bibasilar consolidation. There is slight interstitial edema. Heart is mildly enlarged with pulmonary vascularity within normal limits. There is postoperative change in the thoracic spine. Bones are osteoporotic. There is extensive calcific tendinosis in the right shoulder.  IMPRESSION: Evidence of congestive heart failure. Superimposed bibasilar pneumonia cannot be excluded radiographically.   Electronically Signed   By: Lowella Grip M.D.   On: 11/06/2013 12:51    Scheduled Meds: . apixaban  2.5 mg Oral BID  . diltiazem  300 mg Oral Daily  . furosemide  80  mg Intravenous BID  . insulin aspart  0-5 Units Subcutaneous QHS  . insulin aspart  0-9 Units Subcutaneous TID WC  . iron polysaccharides  150 mg Oral Daily  . multivitamin with minerals  1 tablet Oral Daily  . potassium chloride  20 mEq Oral BID   Continuous Infusions: . amiodarone 30 mg/hr (11/07/13 0027)   PRN Meds:ondansetron  Assessment/Plan: DIASTOLIC CHF: no diuresis, labs pending FTT: likely not reversable AFIB: rate sl better, still in Afib, on amiodarone, eliquuis and diltiazem ANEMIA : pending SWALLOWING ISSUES: try a D2 diet.  DM 2: a new issue. On Rx DNR   LOS: 1 day   Latasha Proctor ALAN 11/07/2013, 8:38 AM

## 2013-11-07 NOTE — Progress Notes (Signed)
Patient: Latasha Proctor / Admit Date: 11/06/2013 / Date of Encounter: 11/07/2013, 7:50 AM   Subjective: Pt is still feeling very fatigued. She is also having increased sputum production from her cough, pt using suction.  Pt is asking for cold water but is having difficulty swallowing according to nursing staff.  She denies SOB yet continues to ask to sit up even though she is sitting completely upright in bed with extra pillows behind her back.  Denies nausea, abdominal pain, bladder fullness, palpitations, chest pain.    Pt was hypotensive around 7:30pm (72/46 manually) - NP on call decreased her amiodarone to 16.63ml/hr.  Pt was given her first dose of lasix after BP improved around 0000 this morning and has only had 45ml of output.  Nursing tried flushing the foley twice and did a bladder scan which showed 28ml of urine.  Labs not back yet.  Objective: Telemetry: rate-controlled atrial fibrillation Physical Exam: Blood pressure 115/51, pulse 83, temperature 97.6 F (36.4 C), temperature source Oral, resp. rate 20, height 5\' 3"  (1.6 m), weight 136 lb 6.4 oz (61.871 kg), SpO2 95.00%. General: Well developed, well nourished, pt appears uncomfortable in bed. Head: Normocephalic, atraumatic, sclera non-icteric, no xanthomas, nares are without discharge. Neck: Negative for carotid bruits. JVP elevated to the earlobe with hepatojugular reflux. Lungs: Coarse anteriorly to auscultation, slightly diminished in the bases without wheezes, rales, or rhonchi. Breathing is shallow. Heart: irregular rhythm S1 S2 with a systolic murmur, no rubs, or gallops.  Abdomen: Soft, non-tender, mildly-distended with diminished bowel sounds. No rebound/guarding. Extremities: No clubbing or cyanosis. 2+ pitting edema in upper and lower extremities. Distal pedal pulses are 2+ and equal bilaterally. Neuro: Alert and oriented to person, place and time. Was able to tell me that she was admitted through the ED yesterday and that  her daughter will be coming again today. Pt requires significant help sitting up in bed.  Moving arms independently. Psych:  Responds to simple questions appropriately.   Intake/Output Summary (Last 24 hours) at 11/07/13 0750 Last data filed at 11/07/13 0657  Gross per 24 hour  Intake 438.85 ml  Output     10 ml  Net 428.85 ml    Inpatient Medications:  . apixaban  2.5 mg Oral BID  . diltiazem  300 mg Oral Daily  . furosemide  80 mg Intravenous BID  . insulin aspart  0-5 Units Subcutaneous QHS  . insulin aspart  0-9 Units Subcutaneous TID WC  . iron polysaccharides  150 mg Oral Daily  . multivitamin with minerals  1 tablet Oral Daily  . potassium chloride  20 mEq Oral BID   Infusions:  . amiodarone 30 mg/hr (11/07/13 0027)    Labs:  Recent Labs  11/06/13 1310  NA 144  K 5.1  CL 98  CO2 31  GLUCOSE 265*  BUN 40*  CREATININE 1.42*  CALCIUM 9.4    Recent Labs  11/06/13 1310  AST 250*  ALT 241*  ALKPHOS 120*  BILITOT 0.9  PROT 7.0  ALBUMIN 3.7    Recent Labs  11/06/13 1310  WBC 13.7*  NEUTROABS 12.3*  HGB 12.6  HCT 42.2  MCV 87.4  PLT 220   Radiology/Studies:  Dg Chest 2 View 11/06/2013   CLINICAL DATA:  Shortness of Breath  EXAM: CHEST  2 VIEW  COMPARISON:  Sep 21, 2013 chest radiograph and Sep 22, 2013 chest CT  FINDINGS: There are bilateral pleural effusions with bibasilar consolidation. There is slight interstitial edema.  Heart is mildly enlarged with pulmonary vascularity within normal limits. There is postoperative change in the thoracic spine. Bones are osteoporotic. There is extensive calcific tendinosis in the right shoulder.  IMPRESSION: Evidence of congestive heart failure. Superimposed bibasilar pneumonia cannot be excluded radiographically.   Electronically Signed   By: Lowella Grip M.D.   On: 11/06/2013 12:51     Assessment and Plan  80 yo WF with past medical history significant for paroxysmal a-fib, CKD stage III, pulmonary HTN, mild  mitral stenosis, moderate aortic stenosis, DM, HLD, HTN, iron deficiency anemia presents with 4-5 months of decline from her baseline.  Cardiology consulted for management of her a-fib with RVR and CHF.  1. Failure to thrive with leukocytosis, cough, weight loss, hypotension - suspect multifactorial, significant wt loss of 50-75lbs in the last year - consult for swallow study today as nursing has expressed some concerns - with hypotension, will also send off blood cultures to exclude sepsis (afebrile)  2. Acute on chronic diastolic CHF.  Due to low BP, negligible urine output despite IV lasix, and pts history of CKD stage III there is concern if the lasix will be effective.  With hypotension overnight, worry we will see a picture of ATN - Morning BMP was not drawn but lab has been paged.  Will hold off on morning dose of lasix until Cr is back. DC KCl until we see what lytes look like - continue to monitor BP - metoprolol on hold, IM decreased Cardizem dose this AM - will discuss need for inotropes vs palliative care direction with MD  3. Paroxysmal atrial fibrillation - continue Eliquis  - continue amiodarone 16.7mg /hr. TSH was 7.288 last week.  TSH still pending this morning. - see above re: med changes - will discuss aggressiveness of care with MD - DCCV vs obs  4. Moderate aortic stenosis, mild mitral stenosis, mild TR by echo 08/2013  5. Transaminitis, suspect due to passive congestion  6. Recently abnormal TSH. Will recheck today. 7. Diabetes mellitus with hyperglycemia  8. CKD stage III with proteinuria, AM labs pending  Signed, Gregary Signs Constant PA-S; also seen by Melina Copa PA-C   I have seen and examined the patient along with Melina Copa PA-C.  I have reviewed the chart, notes and new data.  I agree with PA's note.  Key new complaints: weak, less dyspneic Key examination changes: JVP elevated while sitting up Key new findings / data: delay in labs - K 6.6, creat 2.4, remains  essentially anuric  Appears to have (hypotension related?) acute renal failure  PLAN: Hold diuretics, give IVF Planned renal eval.  Spoke with daughter who says mom would like to pass away peacefully without aggressive measures. Will request a palliative care consult.  Sanda Klein, MD, [place] [phone] 11/07/2013, 12:46 PM '

## 2013-11-07 NOTE — Progress Notes (Signed)
No urine output noted an hour after giving IV lasix. Tried flushing the foley catheter, scanned the bladder with 46ml urine, pt denies any fullness on her bladder. Dr. Clayborne Artist was notified and no new order given. Will continue to monitor pt

## 2013-11-07 NOTE — Consult Note (Signed)
Patient CH:JSCB Latasha Proctor      DOB: [birth year]      IPJ:[id number]  Patient GE:[name]      DOB: 04/14/1918      KTC:[id number]  Summary of Goals of care; full note to follow:  Met with patient and her daughter [name] with family friend who is like a daughter to her, Marlowe Kays.   Patient is clear that her "body is tired and her mind is prepared to go". She states clearly she does not want to hasten death but she does not want to prolong her life either.  She clearly states that she can go home to her house.     She is hard of hearing but is clear she does not want [place] whispering about her.  When asked what she wants to know she asked how much time.  We told her that she likely has days to weeks to enjoy and that we wanted to help that time be the best it could be.   We talked with her about using the [facility] for her care, she had mentioned the one on [address] to her daughter prior to our arrival.  We asked the SW to be proactive and offer choice so that Dr. Forde Dandy would have options in the morning.  She is remarkably asymptomatic at this time.  Most notably she is showing signs of significant dysphagia just with her own saliva.  She has not wanted anything to eat but all agree comfort feeding best texture.  If secretions get too significant could add atropine drops or scopolamine patch.    Updated on call for Dr. Forde Dandy. Will contact Dr. Sallyanne Kuster in the am .   Total time 415 pm -445 pm  Khaniya Tenaglia L. Lovena Le, MD MBA The Palliative Medicine Team at [facility] Phone: [phone] Pager: [phone]

## 2013-11-07 NOTE — Consult Note (Signed)
Patient ER:Latasha Proctor      DOB: 03/20/1918      GQQ:[id number]     Consult Note from the Palliative Medicine Team at [facility] Requested by: Dr. Sallyanne Proctor    PCP: Latasha Stack, MD Reason for Consultation GOC, symptom rec.    Phone Number:[phone]  Assessment of patients Current state: 80 yr old white female in her usual state of health living alone presents with acute decline related to her history of heart failure.  Patient reported weakness on admission and was found to be back in Afib.  Patient was treated with IV diuretics and amiodarone but has progressed on to anuria with  Worsening renal failure and hyperkalemia.  She reports as does her daughter that she understands the gravity of her illness and she is ready to "go".  We talked about  Her desires at this time in her life and she stated she is too weak to be at home and that her daughter should not be burdened with caring for her.  She is familiar with hospice and had already talked with her daughter about the place on [address].  We introduced the concept of hospice facility care and she was very open to this.   At this time she reports minimal symptoms and no real worries.  She states she doesn;t want to hasten her death but she doesn't want to delay it either.  See my note from the day of service.   Goals of Care: 1.  Code Status: DNR   2. Scope of Treatment: For now patient and family satisfied with minimal intervention for her heart failure and afib.  There true goal, however is to promote safety and comfort at the end of her life.    4. Disposition: If agreeable with Dr. Forde Dandy, patient desires to transition to hospice home for end of life care.   3. Symptom Management:   1. Anxiety/Agitation: not an issue at present but could use ativan or haldol low dose if she declines 2. Pain: none reported, she does have small area of redness on her buttocks which can be cared for locally, she might benefit from  air mattress overlay. 3. Bowel Regimen: monitor , no BM since admission. 4. Terminal Secretions: could add atropine as secretions progress  4. Psychosocial: Patient reports that she was very active in playing bridge and in her church.  She has her daughter Latasha Proctor who live in [place], [place]  5. Spiritual: Active in her church, spiritual care offered.        Patient Documents Completed or Given: Document Given Completed  Advanced Directives Pkt    MOST    DNR    Gone from My Sight    Hard Choices      Brief HPI: 80 yr old white female with CHF, now with acute renal failure.  Patient reports I am ready to go we were asked to assist with Goals of care.   ROS: she minimalizes symptoms.  Denies pain, shortness of breath, nausea, vomiting. Mouth dry    PMH:  Past Medical History  Diagnosis Date  . Hyperlipidemia   . Hypertension   . PAF (paroxysmal atrial fibrillation)     a. Hx of PAF for several years. b. s/p DCCV (2 shocks) 10/2013.  . Macular degeneration   . CKD (chronic kidney disease), stage III     stress incontinence, wears depends  . Chronic diastolic CHF (congestive heart failure)   . Moderate  aortic stenosis     a. Mod by echo 08/2013.  . Mitral stenosis     a. Mild by echo 08/2013.  Marland Kitchen Severe protein-calorie malnutrition   . Unintentional weight loss     a. Per PCP note CT abd/pelvis 2015 did not indicate malignancy.  Marland Kitchen Dysphagia   . Iron deficiency anemia     a. Extensive w/u not pursued by pcp in setting of advanced age.  . Pulmonary HTN   . Bilateral pleural effusion     a. 08/2013 - felt due to low albumin state.  . Melanoma of right upper arm     a. s/p wide local excision.  . Hepatic cyst   . Osteopenia   . Pulmonary nodule     a. 29mm by CT 08/2013.  . Type II diabetes mellitus   . Age-related macular degeneration, wet, both eyes      PSH: Past Surgical History  Procedure Laterality Date  . Ankle surgery Right 2003    pins/bone graft  .  Melanoma excision  02/01/2012    Procedure: MELANOMA EXCISION;  Surgeon: Rolm Bookbinder, MD;  Location: [facility];  Service: General;  Laterality: Left;  wide excision left arm melanoma  . Transthoracic echocardiogram  08/2013    EF 55-60%, moderate aortic stenosis, mild mitral stenosis, moderately dilated LA & RA,   . Stress test  08/2013    chemical stress test  . Cardioversion N/A 10/26/2013    Procedure: CARDIOVERSION;  Surgeon: [name], MD;  Location: [facility];  Service: Cardiovascular;  Laterality: N/A;  . Vaginal hysterectomy    . Back surgery  2006    broke upper back - pins/rods  . Cataract extraction w/ intraocular lens  implant, bilateral Bilateral    I have reviewed the [place] and SH and  If appropriate update it with new information. No Known Allergies Scheduled Meds: . diltiazem  180 mg Oral Daily  . feeding supplement (GLUCERNA SHAKE)  237 mL Oral TID WC  . insulin aspart  0-5 Units Subcutaneous QHS  . insulin aspart  0-9 Units Subcutaneous TID WC  . iron polysaccharides  150 mg Oral Daily  . multivitamin with minerals  1 tablet Oral Daily   Continuous Infusions: . sodium chloride 100 mL/hr at 11/07/13 1230  . amiodarone 30 mg/hr (11/07/13 0027)   PRN Meds:.ondansetron    BP 108/50  Pulse 97  Temp(Src) 97.3 F (36.3 C) (Oral)  Resp 20  Ht 5\' 3"  (1.6 m)  Wt 61.871 kg (136 lb 6.4 oz)  BMI 24.17 kg/m2  SpO2 96%   PPS: 10%   Intake/Output Summary (Last 24 hours) at 11/07/13 1719 Last data filed at 11/07/13 9163  Gross per 24 hour  Intake 438.85 ml  Output      0 ml  Net 438.85 ml   LBM: PTA  Physical Exam:  General: mild distress, from dysphagia, articulate, no increased work of breathing HEENT:  PERRL, EOMI, anicteric, MM dry, double swallowing evident Chest:   Per report from Dr. Deitra Mayo- decreased with poor air entry CVS: Per report from Dr. Deitra Mayo distant Ext: cool , no mottling  Labs: CBC    Component Value Date/Time   WBC 13.7*  11/06/2013 1310   RBC 4.83 11/06/2013 1310   RBC 4.25 09/21/2013 0905   HGB 12.6 11/06/2013 1310   HCT 42.2 11/06/2013 1310   PLT 220 11/06/2013 1310   MCV 87.4 11/06/2013 1310   MCH 26.1 11/06/2013 1310  MCHC 29.9* 11/06/2013 1310   RDW 15.6* 11/06/2013 1310   LYMPHSABS 0.6* 11/06/2013 1310   MONOABS 0.8 11/06/2013 1310   EOSABS 0.0 11/06/2013 1310   BASOSABS 0.0 11/06/2013 1310     CMP     Component Value Date/Time   NA 136* 11/07/2013 1110   K 6.6* 11/07/2013 1110   CL 96 11/07/2013 1110   CO2 23 11/07/2013 1110   GLUCOSE 143* 11/07/2013 1110   BUN 49* 11/07/2013 1110   CREATININE 2.40* 11/07/2013 1110   CREATININE 1.04 10/25/2013 1536   CALCIUM 8.8 11/07/2013 1110   PROT 7.0 11/06/2013 1310   ALBUMIN 3.7 11/06/2013 1310   AST 250* 11/06/2013 1310   ALT 241* 11/06/2013 1310   ALKPHOS 120* 11/06/2013 1310   BILITOT 0.9 11/06/2013 1310   GFRNONAA 16* 11/07/2013 1110   GFRAA 19* 11/07/2013 1110    Chest Xray Reviewed/Impressions: Evidence of congestive heart failure. Superimposed bibasilar  pneumonia cannot be excluded radiographically   CT scan of the Head Reviewed/Impressions:From 5/23 1. Bilateral moderate-sized pleural effusions. These appear simple.  2. Patchy airspace opacities in all lobes of the lungs, with a  basilar predominance. This could reflect multilobar pneumonia or a  slightly atypical presentation of pulmonary edema.  3. 5 mm discrete pulmonary nodule right lower lobe. If the patient  is at high risk for bronchogenic carcinoma, follow-up chest CT at  6-12 months is recommended. If the patient is at low risk for  bronchogenic carcinoma, follow-up chest CT at 12 months is  recommended. This recommendation follows the consensus statement:  Guidelines for Management of Small Pulmonary Nodules Detected on CT  Scans: A Statement from the [employer] as published in  Radiology 2005;237:395-400.  4. Single mildly prominent mediastinal lymph node, likely reactive.  5. Cardiomegaly with small  pericardial effusion.  6. Coronary artery atherosclerotic calcification.  7. Hepatic cysts.       Time In Time Out Total Time Spent with Patient Total Overall Time  415 pm 445 pm 30 min 30 min    Greater than 50%  of this time was spent counseling and coordinating care related to the above assessment and plan.  Seen with Dr. Orlene Plum L. Lovena Le, MD MBA The Palliative Medicine Team at [facility] Phone: [phone] Pager: [phone]

## 2013-11-08 LAB — GLUCOSE, CAPILLARY
GLUCOSE-CAPILLARY: 169 mg/dL — AB (ref 70–99)
Glucose-Capillary: 146 mg/dL — ABNORMAL HIGH (ref 70–99)
Glucose-Capillary: 129 mg/dL — ABNORMAL HIGH (ref 70–99)
Glucose-Capillary: 115 mg/dL — ABNORMAL HIGH (ref 70–99)

## 2013-11-08 LAB — HEMOGLOBIN A1C
Hgb A1c MFr Bld: 6.3 % — ABNORMAL HIGH (ref <5.7)
Mean Plasma Glucose: 134 mg/dL — ABNORMAL HIGH (ref <117)

## 2013-11-08 NOTE — Progress Notes (Signed)
PT Cancellation Note  Patient Details Name: Latasha Proctor MRN: [id number] DOB: 28-Nov-1917   Cancelled Treatment:    Reason Eval/Treat Not Completed: PT screened, no needs identified, will sign off. PT noted palliative care involvement and plan for hospice care at [facility] at d/c. If needs change, please reconsult.    Jolyn Lent 11/08/2013, 9:29 AM  Jolyn Lent, PT, DPT Acute Rehabilitation Services Pager: [phone]

## 2013-11-08 NOTE — Consult Note (Signed)
[facility] Liaison: Received request from [facility] at 5:05pm 11/07/13 for family interest in [facility]. Chart reviewed. Unfortunately [facility] is full at this time. Will follow up with CSW later this morning to make aware no [facility] availability today. Thank you. Erling Conte LCSW [phone]

## 2013-11-08 NOTE — Plan of Care (Signed)
Problem: Phase III Progression Outcomes Goal: Tolerating diet Outcome: Not Met (add Reason) Pt not eating much, however  Like drinking the glucerna.  Daughter at the bedside.  Karie Kirks, Therapist, sports.

## 2013-11-08 NOTE — Progress Notes (Signed)
Currently no vacancies at [facility] per Latasha Conte, LCSW.  CSW made referral to [facility]- was notified that they may have a vacancy.  CSW discussed with daughter Latasha Proctor who was hoping that [facility] would have an opening. CSW provided support and will follow up re: possible bed vacancies for residential hospice. Latasha Proctor. [place], [place]

## 2013-11-08 NOTE — Progress Notes (Signed)
Patient Name: Latasha Proctor Date of Encounter: 11/08/2013  Active Problems:   Weight loss, unintentional   Failure to thrive in adult   Acute on chronic diastolic heart failure   Moderate aortic stenosis   PAF (paroxysmal atrial fibrillation)   Hypertension   Hepatic congestion   Leukocytosis   Abnormal TSH   CHF (congestive heart failure), NYHA class II   Hyperkalemia   Length of Stay: 2  SUBJECTIVE  Sleepy, readily arousable. No pain or dyspnea  CURRENT MEDS . diltiazem  180 mg Oral Daily  . feeding supplement (GLUCERNA SHAKE)  237 mL Oral TID WC  . insulin aspart  0-5 Units Subcutaneous QHS  . insulin aspart  0-9 Units Subcutaneous TID WC  . iron polysaccharides  150 mg Oral Daily  . multivitamin with minerals  1 tablet Oral Daily    OBJECTIVE   Intake/Output Summary (Last 24 hours) at 11/08/13 0854 Last data filed at 11/08/13 0827  Gross per 24 hour  Intake 517.07 ml  Output    100 ml  Net 417.07 ml   Filed Weights   11/06/13 1906 11/07/13 0446 11/08/13 0542  Weight: 136 lb 8 oz (61.916 kg) 136 lb 6.4 oz (61.871 kg) 141 lb 3.2 oz (64.048 kg)    PHYSICAL EXAM Filed Vitals:   11/07/13 0446 11/07/13 1400 11/07/13 2145 11/08/13 0542  BP: 115/51 108/50 123/62 127/56  Pulse: 83 97 86 93  Temp: 97.6 F (36.4 C) 97.3 F (36.3 C) 98.3 F (36.8 C) 98 F (36.7 C)  TempSrc: Oral Oral Oral Oral  Resp: 20 20 20 22   Height:      Weight: 136 lb 6.4 oz (61.871 kg)   141 lb 3.2 oz (64.048 kg)  SpO2: 95% 96% 95% 94%   General: Alert, oriented x3, no distress, very thin and weak Head: no evidence of trauma, PERRL, EOMI, no exophtalmos or lid lag, no myxedema, no xanthelasma; normal ears, nose and oropharynx Neck: normal jugular venous pulsations and no hepatojugular reflux; brisk carotid pulses without delay and no carotid bruits Chest: clear to auscultation, no signs of consolidation by percussion or palpation, normal fremitus, symmetrical and full respiratory  excursions Cardiovascular: normal position and quality of the apical impulse, irregular rhythm, normal first and second heart sounds, no rubs or gallops, no murmur Abdomen: no tenderness or distention, no masses by palpation, no abnormal pulsatility or arterial bruits, normal bowel sounds, no hepatosplenomegaly Extremities: no clubbing, cyanosis or edema; 2+ radial, ulnar and brachial pulses bilaterally; 2+ right femoral, posterior tibial and dorsalis pedis pulses; 2+ left femoral, posterior tibial and dorsalis pedis pulses; no subclavian or femoral bruits Neurological: grossly nonfocal - hard-of-hearing  LABS  CBC  Recent Labs  11/06/13 1310  WBC 13.7*  NEUTROABS 12.3*  HGB 12.6  HCT 42.2  MCV 87.4  PLT 161   Basic Metabolic Panel  Recent Labs  11/06/13 1310 11/07/13 1110  NA 144 136*  K 5.1 6.6*  CL 98 96  CO2 31 23  GLUCOSE 265* 143*  BUN 40* 49*  CREATININE 1.42* 2.40*  CALCIUM 9.4 8.8   Liver Function Tests  Recent Labs  11/06/13 1310  AST 250*  ALT 241*  ALKPHOS 120*  BILITOT 0.9  PROT 7.0  ALBUMIN 3.7   No results found for this basename: LIPASE, AMYLASE,  in the last 72 hours Cardiac Enzymes No results found for this basename: CKTOTAL, CKMB, CKMBINDEX, TROPONINI,  in the last 72 hours BNP No components found with  this basename: POCBNP,  D-Dimer No results found for this basename: DDIMER,  in the last 72 hours Hemoglobin A1C No results found for this basename: HGBA1C,  in the last 72 hours Fasting Lipid Panel No results found for this basename: CHOL, HDL, LDLCALC, TRIG, CHOLHDL, LDLDIRECT,  in the last 72 hours Thyroid Function Tests  Recent Labs  11/07/13 1110  TSH 7.910*    Radiology Studies Imaging results have been reviewed and Dg Chest 2 View  11/06/2013   CLINICAL DATA:  Shortness of Breath  EXAM: CHEST  2 VIEW  COMPARISON:  Sep 21, 2013 chest radiograph and Sep 22, 2013 chest CT  FINDINGS: There are bilateral pleural effusions with  bibasilar consolidation. There is slight interstitial edema. Heart is mildly enlarged with pulmonary vascularity within normal limits. There is postoperative change in the thoracic spine. Bones are osteoporotic. There is extensive calcific tendinosis in the right shoulder.  IMPRESSION: Evidence of congestive heart failure. Superimposed bibasilar pneumonia cannot be excluded radiographically.   Electronically Signed   By: Lowella Grip M.D.   On: 11/06/2013 12:51    TELE Afib, controlled rate  ASSESSMENT AND PLAN Latasha Proctor states that she prefers palliative care only. Latasha Proctor has no openings - SW evaluating other options. Will stop meds that have no direct impact on symptom relief (e.g. Eliquis, amiodarone). Will be glad to offer assistance with other cardiac issues, but for now will follow from a distance.   Latasha Klein, MD, [facility] [phone] office [phone] pager 11/08/2013 8:54 AM

## 2013-11-08 NOTE — Progress Notes (Addendum)
Subjective: Cards and pallilative notes reviewed.  Pt is remarkably without sxs. Denies dyspnea. Taking a few liquids   Objective: Vital signs in last 24 hours: Temp:  [97.3 F (36.3 C)-98.3 F (36.8 C)] 98 F (36.7 C) (07/09 0542) Pulse Rate:  [86-97] 93 (07/09 0542) Resp:  [20-22] 22 (07/09 0542) BP: (108-138)/(50-64) 138/64 mmHg (07/09 1013) SpO2:  [94 %-96 %] 94 % (07/09 0542) Weight:  [64.048 kg (141 lb 3.2 oz)] 64.048 kg (141 lb 3.2 oz) (07/09 0542)  Intake/Output from previous day: 07/08 0701 - 07/09 0700 In: 397.1 [P.O.:180; I.V.:217.1] Out: -  Intake/Output this shift: Total I/O In: 120 [P.O.:120] Out: 100 [Urine:100]  Sitting up, frail. O2 in place, bibasilar rales ht IR IR abd soft NT, 2+ edema, awake, mentating well, calm  Lab Results   Recent Labs  11/06/13 1310  WBC 13.7*  RBC 4.83  HGB 12.6  HCT 42.2  MCV 87.4  MCH 26.1  RDW 15.6*  PLT 220    Recent Labs  11/06/13 1310 11/07/13 1110  NA 144 136*  K 5.1 6.6*  CL 98 96  CO2 31 23  GLUCOSE 265* 143*  BUN 40* 49*  CREATININE 1.42* 2.40*  CALCIUM 9.4 8.8    Studies/Results: Dg Chest 2 View  11/06/2013   CLINICAL DATA:  Shortness of Breath  EXAM: CHEST  2 VIEW  COMPARISON:  Sep 21, 2013 chest radiograph and Sep 22, 2013 chest CT  FINDINGS: There are bilateral pleural effusions with bibasilar consolidation. There is slight interstitial edema. Heart is mildly enlarged with pulmonary vascularity within normal limits. There is postoperative change in the thoracic spine. Bones are osteoporotic. There is extensive calcific tendinosis in the right shoulder.  IMPRESSION: Evidence of congestive heart failure. Superimposed bibasilar pneumonia cannot be excluded radiographically.   Electronically Signed   By: Lowella Grip M.D.   On: 11/06/2013 12:51    Scheduled Meds: . diltiazem  180 mg Oral Daily  . feeding supplement (GLUCERNA SHAKE)  237 mL Oral TID WC  . insulin aspart  0-5 Units Subcutaneous QHS   . insulin aspart  0-9 Units Subcutaneous TID WC  . multivitamin with minerals  1 tablet Oral Daily   Continuous Infusions: . sodium chloride 100 mL/hr at 11/07/13 1230   PRN Meds:ondansetron  Assessment/Plan: CHF: agree with palliative plans Afib: off amiodarone ARF: failling kidneys DNR DM 2: stop sugar checks and insulin DISP: to [facility] ? tomorrow   LOS: 2 days   Latasha Proctor ALAN 11/08/2013, 10:32 AM

## 2013-11-08 NOTE — Evaluation (Signed)
Clinical/Bedside Swallow Evaluation Patient Details  Name: Latasha Proctor MRN: [id number] Date of Birth: 18-Jul-1917  Today's Date: 11/08/2013 Time: 6789-3810 SLP Time Calculation (min): 25 min  Past Medical History:  Past Medical History  Diagnosis Date  . Hyperlipidemia   . Hypertension   . PAF (paroxysmal atrial fibrillation)     a. Hx of PAF for several years. b. s/p DCCV (2 shocks) 10/2013.  . Macular degeneration   . CKD (chronic kidney disease), stage III     stress incontinence, wears depends  . Chronic diastolic CHF (congestive heart failure)   . Moderate aortic stenosis     a. Mod by echo 08/2013.  . Mitral stenosis     a. Mild by echo 08/2013.  [place] Kitchen Severe protein-calorie malnutrition   . Unintentional weight loss     a. Per PCP note CT abd/pelvis 2015 did not indicate malignancy.  Mar[place] Kitchen Dysphagia   . Iron deficiency anemia     a. Extensive w/u not pursued by pcp in setting of advanced age.  . Pulmonary HTN   . Bilateral pleural effusion     a. 08/2013 - felt due to low albumin state.  . Melanoma of right upper arm     a. s/p wide local excision.  . Hepatic cyst   . Osteopenia   . Pulmonary nodule     a. 18mm by CT 08/2013.  . Type II diabetes mellitus   . Age-related macular degeneration, wet, both eyes    Past Surgical History:  Past Surgical History  Procedure Laterality Date  . Ankle surgery Right 2003    pins/bone graft  . Melanoma excision  02/01/2012    Procedure: MELANOMA EXCISION;  Surgeon: Rolm Bookbinder, MD;  Location: [facility];  Service: General;  Laterality: Left;  wide excision left arm melanoma  . Transthoracic echocardiogram  08/2013    EF 55-60%, moderate aortic stenosis, mild mitral stenosis, moderately dilated LA & RA,   . Stress test  08/2013    chemical stress test  . Cardioversion N/A 10/26/2013    Procedure: CARDIOVERSION;  Surgeon: [name], MD;  Location: [facility];  Service: Cardiovascular;  Laterality: N/A;  . Vaginal hysterectomy     . Back surgery  2006    broke upper back - pins/rods  . Cataract extraction w/ intraocular lens  implant, bilateral Bilateral    HPI:  80 YO WF with recent CHF admission followed by a SNF stay presents with florid CHF with edema, rales, hepatic congestion and hypoxia. She has been getting weak for a few days. Oral intake has been fair at best.  MBS 08/2013 revealed severe pharyngeal dysphagia and suspected severe esophageal dysphagia with barium filling esophagus.  Dys 2, nectar thick recommended.  Pt. is currently at end of life; Palliative care suggested speaking with pt. to offer swallow precautions if pt. accepting.   Assessment / Plan / Recommendation Clinical Impression  Pt. familiar to this SLP from previous admission.  She has history of MBS 08/2013 revealed severe pharyngeal dysphagia and suspected severe esophageal dysphagia with barium filling esophagus and Dys 2, nectar thick liquids recommended.  Severe pharyngeal dysphagia continues per clinical observations this morning.  SLP demonstrated verball/tactile/visual assist for self suctioning that assisted in briefly clearing/reducing pharyngea mucous.  Pt. very lucid, with releveant comments, pertinent questions and accepting of her prognosis.  She was given various options of diet and liquid textures and chose to remain on thin and Dys 2 diet.  Reviewed precautions  with pt. and RN.  SLP will sign off but please re consult if needed.    Aspiration Risk  Severe    Diet Recommendation Dysphagia 2 (Fine chop);Thin liquid   Liquid Administration via: Straw;Cup Medication Administration: Whole meds with puree Supervision: Full supervision/cueing for compensatory strategies;Staff to assist with self feeding Compensations: Slow rate;Small sips/bites;Check for pocketing Postural Changes and/or Swallow Maneuvers: Seated upright 90 degrees;Upright 30-60 min after meal    Other  Recommendations Oral Care Recommendations: Oral care BID   Follow  Up Recommendations  None    Frequency and Duration        Pertinent Vitals/Pain WDL         Swallow Study         Oral/Motor/Sensory Function Overall Oral Motor/Sensory Function:  (generalized weakness)   Ice Chips Ice chips: Not tested   Thin Liquid Thin Liquid: Impaired Presentation: Straw Pharyngeal  Phase Impairments: Suspected delayed Swallow;Decreased hyoid-laryngeal movement;Multiple swallows;Wet Vocal Quality;Throat Clearing - Immediate;Cough - Immediate;Throat Clearing - Delayed    Nectar Thick Nectar Thick Liquid: Not tested   Honey Thick Honey Thick Liquid: Not tested   Puree Puree: Not tested   Solid   GO    Solid: Not tested       Houston Siren M.Ed [employer] [phone]  11/08/2013

## 2013-11-08 NOTE — Progress Notes (Signed)
CSW met with patient and daughter [name] to discuss residential hospice options. Palliative care- Dr. Lovena Le has met with patient and daughter for goals of care with recommendation for residential hospice. CSW gave bed options- first choice is [facility]; second choice would be [facility].  CSW contacted Erling Conte- LCSW [facility] for [facility] for follow up.  SW psychosocial assessment to follow.  Lorie Phenix. [place], [place]

## 2013-11-09 LAB — GLUCOSE, CAPILLARY: GLUCOSE-CAPILLARY: 139 mg/dL — AB (ref 70–99)

## 2013-11-09 MED ORDER — DILTIAZEM HCL ER COATED BEADS 180 MG PO CP24: 180.0000 mg | ORAL_CAPSULE | Freq: Every day | ORAL | Status: AC

## 2013-11-09 NOTE — Progress Notes (Addendum)
1054 Transferred to [place] via [place] pt no apparent distress . Pt fully awake alert and oriented . Speech clear. Daughter in attendance Pt with 02 3/m [place] and foley cath as ordere

## 2013-11-09 NOTE — Progress Notes (Signed)
Wrong pt vitals  

## 2013-11-09 NOTE — Progress Notes (Signed)
CSW received call this a.m from Erling Conte, [place]. A vacancy occurred overnight and they will be able to accept patient this a.m.  MD: Please plan d/c if stable- will need d/c summary asap. Please call me if you see this note- [phone]- BP has a question re: following physician.  CSW notified patient's daughter [name]- she is on her way to the hospital and will plan to meet with Ms. Rosana Hoes this morning to complete [employer] admission paperwork.  Lorie Phenix. Pauline Good, [place]

## 2013-11-09 NOTE — Progress Notes (Signed)
1506 telephone given to [facility] , Nurse from [facility] . Updates about received

## 2013-11-09 NOTE — Care Management Note (Addendum)
  Page 1 of 1   11/09/2013     12:08:42 PM CARE MANAGEMENT NOTE 11/09/2013  Patient:  Latasha, Proctor   Account Number:  [id number]  Date Initiated:  11/09/2013  Documentation initiated by:  Mariann Laster  Subjective/Objective Assessment:   CHF     Action/Plan:   CM to follow for dispositon needs   Anticipated DC Date:  11/09/2013   Anticipated DC Plan:  [place]  In-house referral  Clinical Social Worker      DC Planning Services  CM consult      [facility] Choice  HOSPICE   Choice offered to / List presented to:  NA           [place]   Status of service:  Completed, signed off Medicare Important Message given?  YES (If response is "NO", the following Medicare IM given date fields will be blank) Date Medicare IM given:  11/09/2013 Medicare IM given by:  Zhania Shaheen Date Additional Medicare IM given:   Additional Medicare IM given by:    Discharge Disposition:  [place]  Per UR Regulation:  Reviewed for med. necessity/level of care/duration of stay  If discussed at [place] of Stay Meetings, dates discussed:    Comments:  Jamilynn Whitacre RN, BSN, MSHL, CCM  Nurse - Case Manager,  ([facility])  [phone]  11/09/2013 Disposition:  IP Hospice Care:  [facility]

## 2013-11-09 NOTE — Consult Note (Signed)
[facility] Liaison: [employer] available for Ms. Latasha Proctor today. CSW aware and confirmed family agreeable. Met with patient and daughter at bedside to answer questions, confirm Dr. Forde Dandy to attend with symptom management assistance from Dr. Tomasa Hosteller, and complete paper work. Discharge summary has been faxed. Please arrange transport for patient to arrive before noon if possible. RN please call report to [phone]. Thank you. Erling Conte LCSW [phone]

## 2013-11-09 NOTE — Discharge Summary (Addendum)
DISCHARGE SUMMARY  Latasha Proctor  MR#: [id number]  DOB:1918/04/17  Date of Admission: 11/06/2013 Date of Discharge: 11/09/2013  Attending Physician:Latasha Proctor  Patient's OZD:[id number] Latasha Proctor  Consults:Treatment Team:  Latasha Proctor [facility], [facility]    Discharge Diagnoses: Active Problems:      Acute on chronic diastolic heart failure, end stage Acute renal failure anemia Weight loss, unintentional, over 50 pounds   Failure to thrive in adult   Moderate aortic stenosis   PAF (paroxysmal atrial fibrillation)   Hypertension   Hepatic congestion, from congestive heart failure   Leukocytosis   Abnormal TSH, hypothyroid DO NOT RESUSCITATE status      Discharge Medications:   Medication List    STOP taking these medications       apixaban 2.5 MG Tabs tablet  Commonly known as:  ELIQUIS     furosemide 40 MG tablet  Commonly known as:  LASIX     multivitamin with minerals Tabs tablet     NU-IRON 150 MG capsule  Generic drug:  iron polysaccharides     ondansetron 4 MG tablet  Commonly known as:  ZOFRAN     potassium chloride SA 20 MEQ tablet  Commonly known as:  K-DUR,KLOR-CON      TAKE these medications       diltiazem 180 MG 24 hr capsule  Commonly known as:  CARDIZEM CD  Take 1 capsule (180 mg total) by mouth daily.     metoprolol tartrate 25 MG tablet  Commonly known as:  LOPRESSOR  Take 1 tablet (25 mg total) by mouth 2 (two) times daily.        Hospital Procedures: Dg Chest 2 View  11/06/2013   CLINICAL DATA:  Shortness of Breath  EXAM: CHEST  2 VIEW  COMPARISON:  Sep 21, 2013 chest radiograph and Sep 22, 2013 chest CT  FINDINGS: There are bilateral pleural effusions with bibasilar consolidation. There is slight interstitial edema. Heart is mildly enlarged with pulmonary vascularity within normal limits. There is postoperative change in the thoracic spine. Bones are osteoporotic. There is extensive calcific tendinosis in  the right shoulder.  IMPRESSION: Evidence of congestive heart failure. Superimposed bibasilar pneumonia cannot be excluded radiographically.   Electronically Signed   By: Lowella Grip M.D.   On: 11/06/2013 12:51    History of Present Illness: dyspnea  Hospital Course: This is a 80 year old white female who had recently been under my care both the hospital and nursing home with atrial fibrillation and heart failure. She presented roughly one week after discharge to the assisted living facility. She presented with significant congestive heart failure with hepatic congestion and A. fib. She had been cardioverted but did not stay in sinus rhythm. She was treated aggressively with  diuresis and supplemental oxygen but basically became anuric. Her creatinine doubled overnight and was clear that she had both renal and cardiac failure. Based on her age and other comorbidities was elected to make her comfort care only. Accordingly amiodarone and Ellquis was were stopped. She's now on only couple medications as noted above. Her blood sugars were up and she was treated initially but were not take these anymore. She also has some early hypothyroidism overnight and treat. Remarkably she doesn't have too many symptoms at the present time. She does have to sit up to be comfortable breathing.  supplemental oxygen has been helpful. She's not eating much of anything at all. She is very comfortable with the idea of comfort care as is her  daughter. We are sending [facility]. Will not be checking sugars or labs. She has had swallowing difficulties but we are not   going to pursue these Day of Discharge Exam BP 119/62  Pulse 103  Temp(Src) 97.6 F (36.4 C) (Oral)  Resp 20  Ht 5\' 3"  (1.6 m)  Wt 63.64 kg (140 lb 4.8 oz)  BMI 24.86 kg/m2  SpO2 97%  Physical Exam: General appearance: frail white female oxygen infusing  Eyes: no scleral icterus Throat: oropharynx moist without erythema, dentition fair   Resp: bilateral rales about a quarter to half the way up  Cardio: Irregularly irregular and somewhat fast with an aortic murmur GI: soft, non-tender; bowel sounds normal; no masses,  no organomegaly Extremities:  2+ edema   neuro: She is awake mentating well in good spirits and calm  Discharge Labs:  Recent Labs  11/06/13 1310 11/07/13 1110  NA 144 136*  K 5.1 6.6*  CL 98 96  CO2 31 23  GLUCOSE 265* 143*  BUN 40* 49*  CREATININE 1.42* 2.40*  CALCIUM 9.4 8.8    Recent Labs  11/06/13 1310  AST 250*  ALT 241*  ALKPHOS 120*  BILITOT 0.9  PROT 7.0  ALBUMIN 3.7    Recent Labs  11/06/13 1310  WBC 13.7*  NEUTROABS 12.3*  HGB 12.6  HCT 42.2  MCV 87.4  PLT 220   Lab Results  Component Value Date   INR 1.22 10/25/2013   INR 1.51* 05/27/2010     Recent Labs  11/07/13 1110  TSH 7.910*     Discharge instructions:   Disposition: to [facility]     Condition on Discharge: guarded  Tests Needing Follow-up:  None  Signed: Alyviah Crandle Proctor 11/09/2013, 8:49 AM

## 2013-11-11 NOTE — Progress Notes (Signed)
Clinical Social Worker facilitated patient discharge including contacting patient family and facility to confirm patient discharge plans.  Clinical information faxed to facility and family agreeable with plan. CSW arranged ambulance transport via [facility] to [facility]. Patient and daughter- Latasha Proctor were extremely happy about the bed vacancy at [facility].  RN calledl report prior to discharge. Clinical Social Worker will sign off for now as social work intervention is no longer needed. [name], [place]

## 2013-11-11 NOTE — Clinical Social Work Psychosocial (Addendum)
Clinical Social Work Department BRIEF PSYCHOSOCIAL ASSESSMENT 11/08/2013  Patient:  Latasha Proctor, Latasha Proctor     Account Number:  [id number]     Admit date:  11/06/2013  Clinical Social Worker:  Iona Coach  Date/Time:  11/08/2013 05:00 PM  Referred by:  Physician  Date Referred:  11/08/2013 Referred for  Other - See comment   Other Referral:   Residential Hospice   Interview type:  Other - See comment Other interview type:   Patient and daughter Latasha Proctor    PSYCHOSOCIAL DATA Living Status:  FACILITY Admitted from facility:  [facility] Level of care:  Assisted Living Primary support name:  Latasha Proctor  [phone] Primary support relationship to patient:  CHILD, ADULT Degree of support available:   Very Strong support    CURRENT CONCERNS Current Concerns  Other - See comment   Other Concerns:   [place] / PLAN 80 year old female- admitted from assisted living. Patient and her daughter met with Palliative Care services and residential hospice home placement is recommended. CSW met with patient and daughter Latasha Proctor and provided hospice home options.Their first choice is [facility]; 2nd would be [facility].  CSW notified Erling Conte- [place]. Currently no beds but will place on waiting list.   Assessment/plan status:  Psychosocial Support/Ongoing Assessment of Needs Other assessment/ plan:   Information/referral to community resources:   Residential Hospice Home list provided    PATIENT'S/FAMILY'S RESPONSE TO PLAN OF CARE: Patient is somewhat alert but very sleepy during the visit. CSW was able to relate to patient what plan of care invovled and she was in agreement. Support provided to patient and her daughter Latasha Proctor who is in full agreement and tearful as expected.  CSW provided clinical support and encouraged her to talk about her concerns, fears, hopes and wishes. Patient was noted to be quite  stoic and was able to verbalize an understanding of the need for residential hospice.

## 2013-11-13 ENCOUNTER — Telehealth: Payer: Self-pay | Admitting: Internal Medicine

## 2013-11-13 LAB — CULTURE, BLOOD (ROUTINE X 2)
CULTURE: NO GROWTH
CULTURE: NO GROWTH

## 2013-11-14 ENCOUNTER — Telehealth: Payer: Self-pay

## 2013-11-14 NOTE — Telephone Encounter (Signed)
Patient died per [facility] °

## 2013-11-21 ENCOUNTER — Ambulatory Visit: Payer: Medicare Other | Admitting: Cardiology

## 2013-12-01 NOTE — Telephone Encounter (Signed)
FYI   Pt passed away this morning.

## 2013-12-01 DEATH — deceased

## 2016-04-27 IMAGING — CT CT CHEST W/ CM
2 of 3 series · 15 of 36 positions shown, 18 images · IV contrast (Omni 300)
Comparison: Chest radiograph 09/21/2013. CT abdomen pelvis
09/20/1998

CLINICAL DATA: Weight loss. Abnormal chest radiograph small left
pleural effusion visualized.

EXAM:
CT CHEST WITH CONTRAST
TECHNIQUE: Multidetector CT imaging of the chest was performed during
intravenous contrast administration.
CONTRAST:  50mL OMNIPAQUE IOHEXOL 300 MG/ML  SOLN

[Series 2: thorax 5.0 i31f 1 · axial · 0.61mm/px · z∈[-773,-503]mm · 12 of 64 slices shown, 15 images]
[im 5/64  mediastinal]
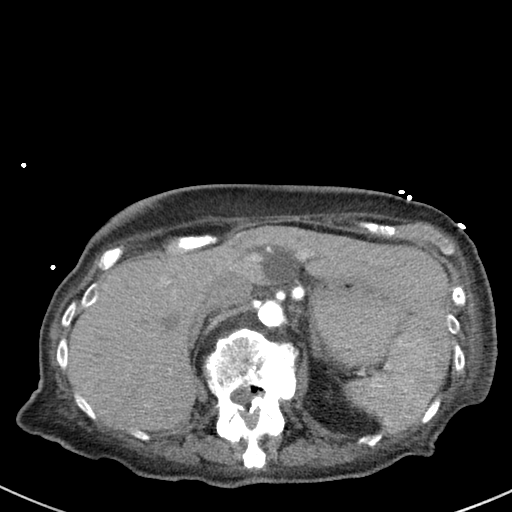
[im 5/64  lung]
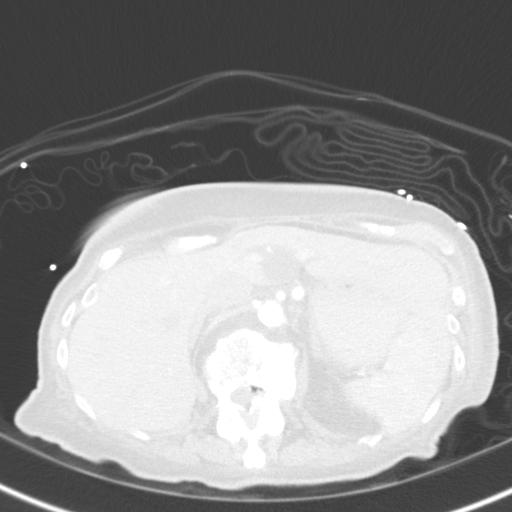
[im 10/64  lung]
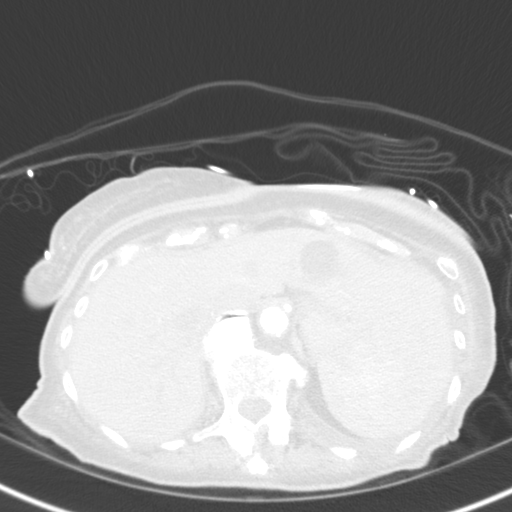
[im 15/64  lung]
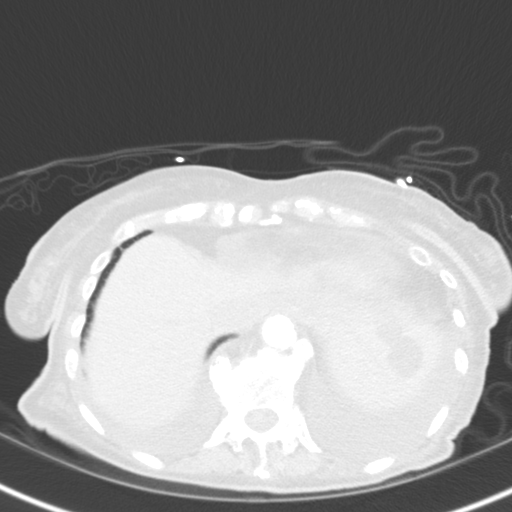
[im 19/64  lung]
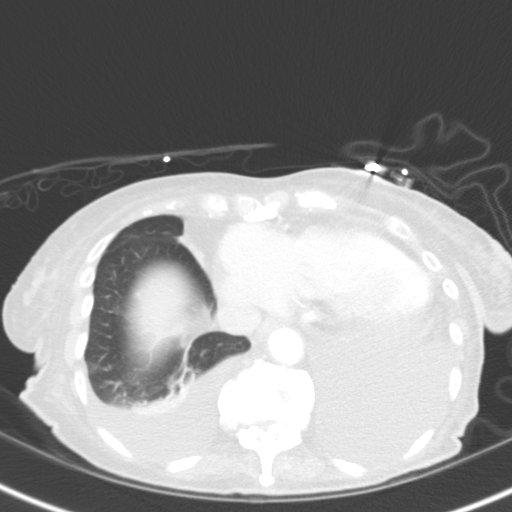
[im 24/64  mediastinal]
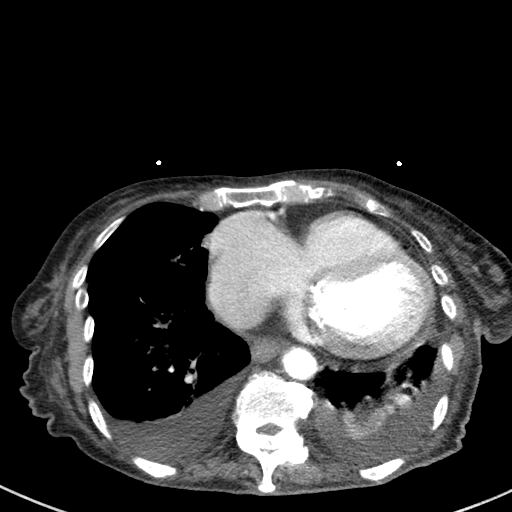
[im 24/64  lung]
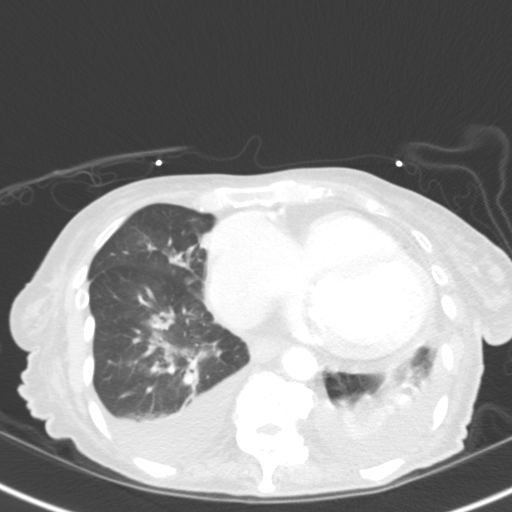
[im 29/64  lung]
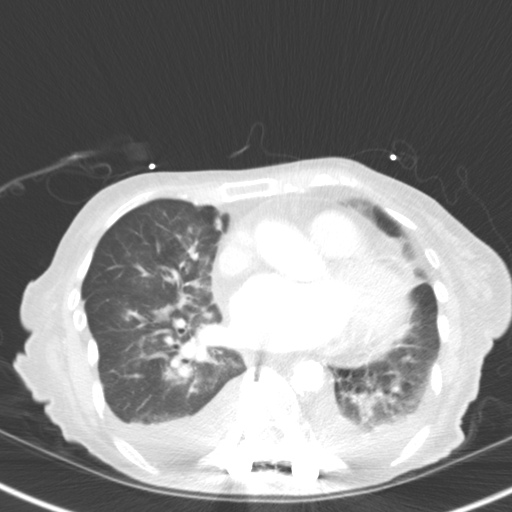
[im 36/64  lung]
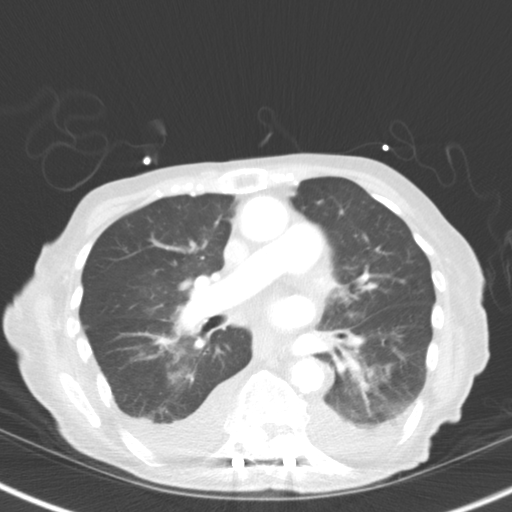
[im 40/64  lung]
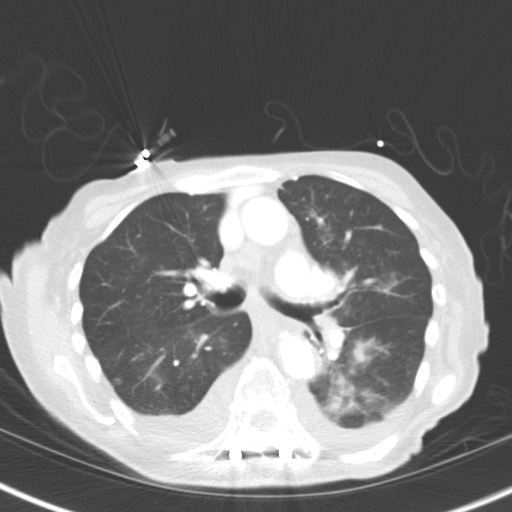
[im 45/64  mediastinal]
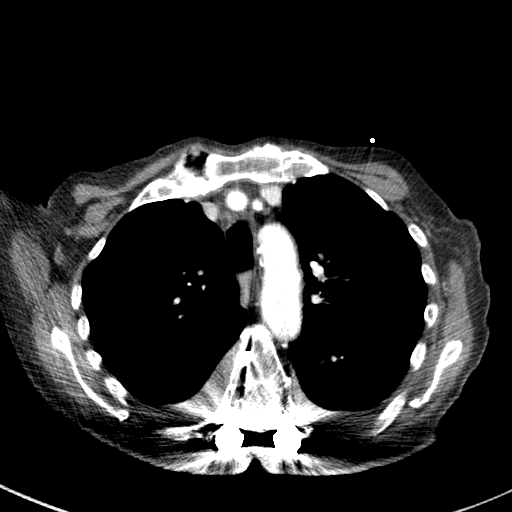
[im 45/64  lung]
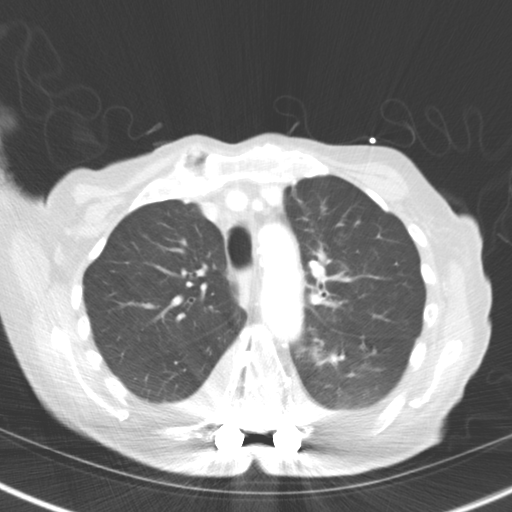
[im 50/64  lung]
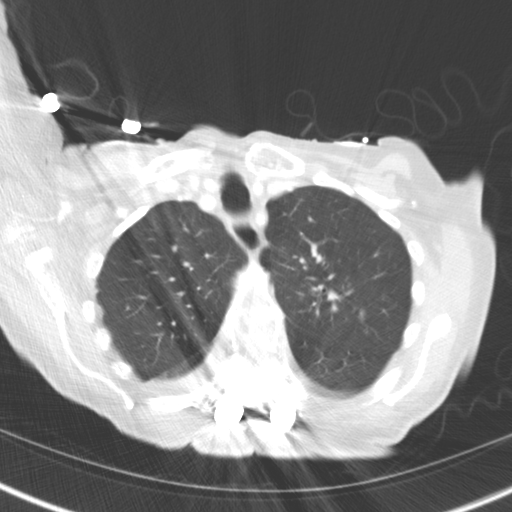
[im 54/64  lung]
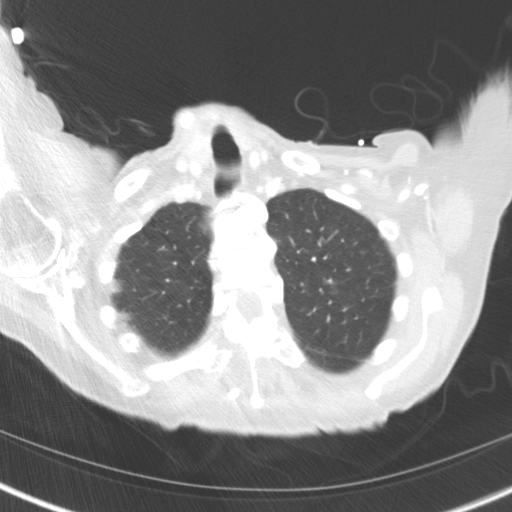
[im 59/64  lung]
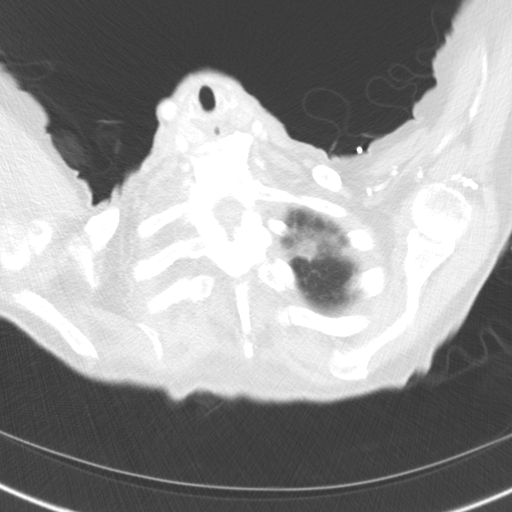

[Series 5: coronal · coronal · 0.62mm/px · 3 of 70 slices shown]
[im 14/70  lung]
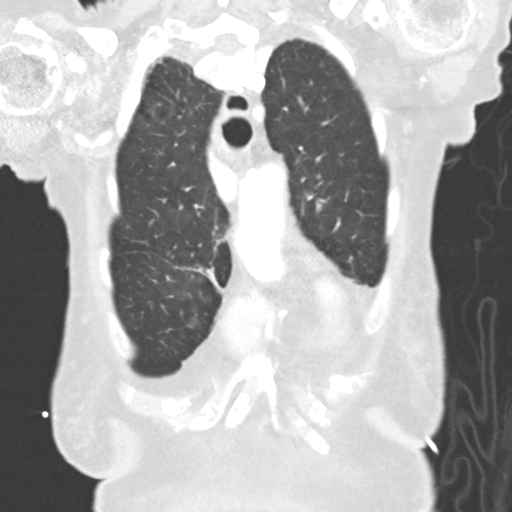
[im 28/70  lung]
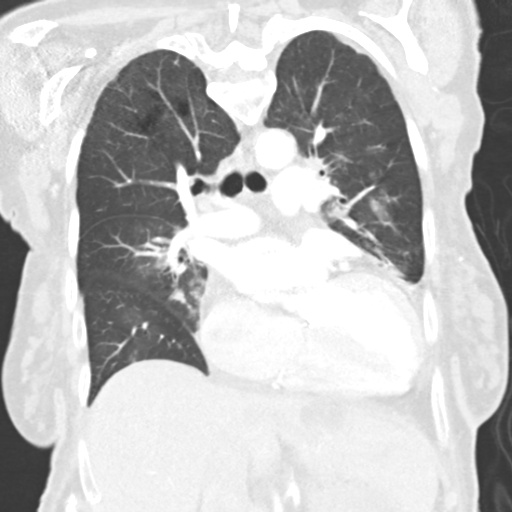
[im 42/70  lung]
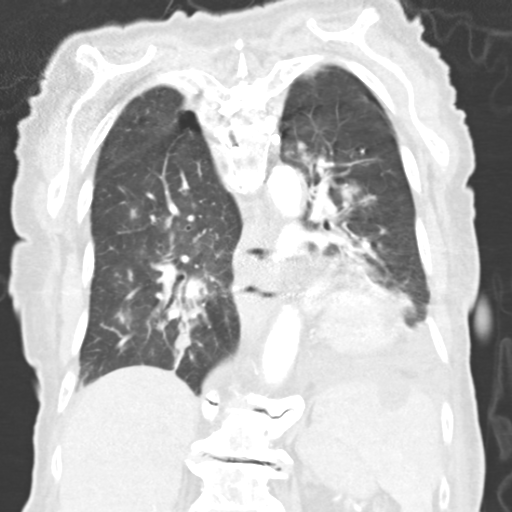

[15 of 36 positions shown; findings below may reference images not displayed]

FINDINGS: Heart is moderately enlarged. Both atria and the left ventricle are
dilated. Left ventricular wall thickening noted. There is
calcification of the mitral annulus. Faint calcification noted in
the region of the aortic valve.

Thoracic aorta is normal in caliber and contains moderate
atherosclerotic calcification. There is some calcification of the
left coronary artery.

There are bilateral moderately-sized symmetric and simple appearing
bilateral pleural effusions. Very small pericardial effusion seen
inferiorly.

Negative for supraclavicular axillary, or hilar lymphadenopathy.
There is a mildly prominent 13 mm precarinal mediastinal lymph node.
Esophagus is unremarkable.

Lung discuss that lung windows demonstrate bilateral scattered
patchy airspace opacities. These are most prominent in the lower
lobes near the lung bases, the right middle lobe, and lingula.

There is a single discrete 5 mm oval right lower lobe pulmonary
nodule on image number 31 of series 3. No pulmonary mass is seen.

Postsurgical changes of posterior spinal fusion spanning T6 through
T10. Vertebral bodies are normal in height and alignment. No
suspicious bony abnormality.

Upper abdomen: There are multiple circumscribed water density
lesions in the liver consistent with hepatic cysts, unchanged on
recent CT abdomen 09/19/2013. Slight thickening of both adrenal
glands is symmetric and stable. No discrete adrenal mass.
IMPRESSION: 1. Bilateral moderate-sized pleural effusions.  These appear simple.
2. Patchy airspace opacities in all lobes of the lungs, with a
basilar predominance. This could reflect multilobar pneumonia or a
slightly atypical presentation of pulmonary edema.
3. 5 mm discrete pulmonary nodule right lower lobe. If the patient
is at high risk for bronchogenic carcinoma, follow-up chest CT at
6-12 months is recommended. If the patient is at low risk for
bronchogenic carcinoma, follow-up chest CT at 12 months is
recommended. This recommendation follows the consensus statement:
Guidelines for Management of Small Pulmonary Nodules Detected on CT
Scans: A Statement from the [HOSPITAL] as published in
4. Single mildly prominent mediastinal lymph node, likely reactive.
5. Cardiomegaly with small pericardial effusion.
6. Coronary artery atherosclerotic calcification.
7. Hepatic cysts.
